# Patient Record
Sex: Male | Born: 1955
Health system: Southern US, Community
[De-identification: ages and names within clinical notes are randomized; demographics above are authoritative.]

## PROBLEM LIST (undated history)

## (undated) DIAGNOSIS — J449 Chronic obstructive pulmonary disease, unspecified: Secondary | ICD-10-CM

## (undated) DIAGNOSIS — D1720 Benign lipomatous neoplasm of skin and subcutaneous tissue of unspecified limb: Secondary | ICD-10-CM

## (undated) DIAGNOSIS — H9190 Unspecified hearing loss, unspecified ear: Secondary | ICD-10-CM

## (undated) DIAGNOSIS — D649 Anemia, unspecified: Secondary | ICD-10-CM

## (undated) DIAGNOSIS — E785 Hyperlipidemia, unspecified: Secondary | ICD-10-CM

## (undated) DIAGNOSIS — F419 Anxiety disorder, unspecified: Secondary | ICD-10-CM

## (undated) DIAGNOSIS — K219 Gastro-esophageal reflux disease without esophagitis: Secondary | ICD-10-CM

## (undated) DIAGNOSIS — N451 Epididymitis: Secondary | ICD-10-CM

## (undated) DIAGNOSIS — Z789 Other specified health status: Secondary | ICD-10-CM

## (undated) HISTORY — DX: Benign lipomatous neoplasm of skin and subcutaneous tissue of unspecified limb: D17.20

## (undated) HISTORY — PX: VASECTOMY: SHX75

## (undated) HISTORY — DX: Epididymitis: N45.1

## (undated) HISTORY — PX: CYST EXCISION: SHX5701

## (undated) HISTORY — PX: TONSILLECTOMY: SUR1361

## (undated) HISTORY — DX: Unspecified hearing loss, unspecified ear: H91.90

## (undated) HISTORY — PX: COLONOSCOPY: SHX174

## (undated) HISTORY — DX: Hyperlipidemia, unspecified: E78.5

---

## 1998-08-20 ENCOUNTER — Encounter: Payer: Self-pay | Admitting: Otolaryngology

## 1998-08-20 ENCOUNTER — Ambulatory Visit (HOSPITAL_COMMUNITY): Admission: RE | Admit: 1998-08-20 | Discharge: 1998-08-20 | Payer: Self-pay | Admitting: Otolaryngology

## 2001-04-04 ENCOUNTER — Ambulatory Visit (HOSPITAL_COMMUNITY): Admission: RE | Admit: 2001-04-04 | Discharge: 2001-04-04 | Payer: Self-pay | Admitting: Gastroenterology

## 2003-09-06 ENCOUNTER — Emergency Department (HOSPITAL_COMMUNITY): Admission: EM | Admit: 2003-09-06 | Discharge: 2003-09-06 | Payer: Self-pay | Admitting: Emergency Medicine

## 2011-07-07 ENCOUNTER — Other Ambulatory Visit: Payer: Self-pay | Admitting: Dermatology

## 2011-07-07 ENCOUNTER — Other Ambulatory Visit (HOSPITAL_COMMUNITY): Payer: Self-pay | Admitting: Dermatology

## 2011-07-07 DIAGNOSIS — D1779 Benign lipomatous neoplasm of other sites: Secondary | ICD-10-CM

## 2011-07-09 ENCOUNTER — Other Ambulatory Visit (HOSPITAL_COMMUNITY): Payer: Self-pay

## 2012-02-16 ENCOUNTER — Other Ambulatory Visit: Payer: Self-pay | Admitting: Gastroenterology

## 2014-06-29 ENCOUNTER — Ambulatory Visit (INDEPENDENT_AMBULATORY_CARE_PROVIDER_SITE_OTHER): Payer: Self-pay | Admitting: Surgery

## 2014-06-29 NOTE — H&P (Signed)
Patient words: CYST ON ARM.  The patient is a 59 year old male who presents with a complaint of Mass. Referred by Dr. London Pepper for evaluation of left arm lipoma The patient is a 59 yo male in good health who presents with a long history of a slowly enlarging mass on his left upper arm. This mass has been present for many years. It has become quite large and protrudes noticeably. It causes minimal discomfort. However, because of the persistent enlargement, he would like to have this area removed. This has never become infected. His wife is a Marine scientist at [employer] on [address]. Other Problems Elbert Ewings, CMA; 06/29/2014 10:03 AM) Gastroesophageal Reflux Disease  Past Surgical History Elbert Ewings, CMA; 06/29/2014 10:03 AM) Colon Polyp Removal - Colonoscopy Oral Surgery Vasectomy  Diagnostic Studies History Elbert Ewings, CMA; 06/29/2014 10:03 AM) Colonoscopy 1-5 years ago  Allergies Elbert Ewings, CMA; 06/29/2014 10:03 AM) No Known Drug Allergies 06/29/2014  Medication History Elbert Ewings, CMA; 06/29/2014 10:03 AM) No Current Medications Medications Reconciled  Social History Elbert Ewings, CMA; 06/29/2014 10:03 AM) Alcohol use Moderate alcohol use. Caffeine use Coffee. No drug use Tobacco use Former smoker.  Family History Elbert Ewings, [place]; 06/29/2014 10:03 AM) Breast Cancer Sister. Colon Cancer Father. Family history unknown First Degree Relatives Heart disease in male family member before age 13 Melanoma Father. Prostate Cancer Father. Thyroid problems Mother.     Review of Systems Elbert Ewings CMA; 06/29/2014 10:03 AM) General Not Present- Appetite Loss, Chills, Fatigue, Fever, Night Sweats, Weight Gain and Weight Loss. Skin Not Present- Change in Wart/Mole, Dryness, Hives, Jaundice, New Lesions, Non-Healing Wounds, Rash and Ulcer. HEENT Present- Wears glasses/contact lenses. Not Present- Earache, Hearing Loss, Hoarseness, Nose Bleed, Oral Ulcers, Ringing  in the Ears, Seasonal Allergies, Sinus Pain, Sore Throat, Visual Disturbances and Yellow Eyes. Respiratory Present- Snoring. Not Present- Bloody sputum, Chronic Cough, Difficulty Breathing and Wheezing. Breast Not Present- Breast Mass, Breast Pain, Nipple Discharge and Skin Changes. Cardiovascular Not Present- Chest Pain, Difficulty Breathing Lying Down, Leg Cramps, Palpitations, Rapid Heart Rate, Shortness of Breath and Swelling of Extremities. Gastrointestinal Not Present- Abdominal Pain, Bloating, Bloody Stool, Change in Bowel Habits, Chronic diarrhea, Constipation, Difficulty Swallowing, Excessive gas, Gets full quickly at meals, Hemorrhoids, Indigestion, Nausea, Rectal Pain and Vomiting. Male Genitourinary Not Present- Blood in Urine, Change in Urinary Stream, Frequency, Impotence, Nocturia, Painful Urination, Urgency and Urine Leakage. Musculoskeletal Not Present- Back Pain, Joint Pain, Joint Stiffness, Muscle Pain, Muscle Weakness and Swelling of Extremities. Neurological Not Present- Decreased Memory, Fainting, Headaches, Numbness, Seizures, Tingling, Tremor, Trouble walking and Weakness. Psychiatric Not Present- Anxiety, Bipolar, Change in Sleep Pattern, Depression, Fearful and Frequent crying. Endocrine Not Present- Cold Intolerance, Excessive Hunger, Hair Changes, Heat Intolerance, Hot flashes and New Diabetes. Hematology Not Present- Easy Bruising, Excessive bleeding, Gland problems, HIV and Persistent Infections.  Vitals Elbert Ewings CMA; 06/29/2014 10:04 AM) 06/29/2014 10:03 AM Weight: 198 lb Height: 73in Body Surface Area: 2.15 m Body Mass Index: 26.12 kg/m Temp.: 96.32F(Temporal)  Pulse: 72 (Regular)  Resp.: 16 (Unlabored)  BP: 128/62 (Sitting, Left Arm, Standard)     Physical Exam Rodman Key K. Kathrin Folden MD; 06/29/2014 10:31 AM)  The physical exam findings are as follows: Note:WDWN in NAD HEENT: EOMI, sclera anicteric Neck: No masses, no thyromegaly Lungs: CTA  bilaterally; normal respiratory effort CV: Regular rate and rhythm; no murmurs Abd: +bowel sounds, soft, non-tender, no masses Ext: Well-perfused; left upper arm over biceps - 12 cm protruding soft tissue mass; smooth; mobile  Skin: Warm, dry; no sign of jaundice    Assessment & Plan Imogene Burn. 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 The surgical procedure has been discussed with the patient. Potential risks, benefits, alternative treatments, and expected outcomes have been explained. All of the patient's questions at this time have been answered. The likelihood of reaching the patient's treatment goal is good. The patient understand the proposed surgical procedure and wishes to proceed.   Imogene Burn. [name], MD, [facility] Surgery  General/ Trauma Surgery  06/29/2014 10:37 AM

## 2014-07-17 ENCOUNTER — Other Ambulatory Visit (HOSPITAL_COMMUNITY): Payer: Self-pay | Admitting: *Deleted

## 2014-07-17 NOTE — Pre-Procedure Instructions (Addendum)
Charles Myers  07/17/2014   Your procedure is scheduled on:  Friday, July 27, 2014 at 7:30 AM.   Report to [facility] Entrance "A" Admitting Office at 5:30 AM.   Call this number if you have problems the morning of surgery: [phone]               Any questions prior to day of surgery, please call [phone] between 8 & 4 PM.   Remember:   Do not eat food or drink liquids after midnight Thursday, 07/26/14.    Take these medicines the morning of surgery with A SIP OF WATER: None  Stop Vitamins 7 days prior to surgery.   Do not wear jewelry.  Do not wear lotions, powders, or cologne. You may wear deodorant.  Do not shave 48 hours prior to surgery. Men may shave face and neck.  Do not bring valuables to the hospital.  [facility] is not responsible   for any belongings or valuables.               Contacts, dentures or bridgework may not be worn into surgery.  Leave suitcase in the car. After surgery it may be brought to your room.  For patients admitted to the hospital, discharge time is determined by your  treatment team.               Patients discharged the day of surgery will not be allowed to drive home.    Special Instructions: [name] - Preparing for Surgery  Before surgery, you can play an important role.  Because skin is not sterile, your skin needs to be as free of germs as possible.  You can reduce the number of germs on you skin by washing with CHG (chlorahexidine gluconate) soap before surgery.  CHG is an antiseptic cleaner which kills germs and bonds with the skin to continue killing germs even after washing.  Please DO NOT use if you have an allergy to CHG or antibacterial soaps.  If your skin becomes reddened/irritated stop using the CHG and inform your nurse when you arrive at Short Stay.  Do not shave (including legs and underarms) for at least 48 hours prior to the first CHG shower.  You may shave your face.  Please follow these instructions  carefully:   1.  Shower with CHG Soap the night before surgery and the   morning of Surgery.  2.  If you choose to wash your hair, wash your hair first as usual with you  normal shampoo.  3.  After you shampoo, rinse your hair and body thoroughly to remove the  Shampoo.  4.  Use CHG as you would any other liquid soap.  You can apply chg directly  to the skin and wash gently with scrungie or a clean washcloth.  5.  Apply the CHG Soap to your body ONLY FROM THE NECK DOWN.        Do not use on open wounds or open sores.  Avoid contact with your eyes, ears, mouth and genitals (private parts).  Wash genitals (private parts) with your normal soap.  6.  Wash thoroughly, paying special attention to the area where your surgery        will be performed.  7.  Thoroughly rinse your body with warm water from the neck down.  8.  DO NOT shower/wash with your normal soap after using and rinsing off  the CHG Soap.  9.  Pat yourself  dry with a clean towel.            10.  Wear clean pajamas.            11.  Place clean sheets on your bed the night of your first shower and do not  sleep with pets.  Day of Surgery  Do not apply any lotions the morning of surgery.  Please wear clean clothes to the hospital.     Please read over the following fact sheets that you were given: Pain Booklet and Surgical Site Infection Prevention

## 2014-07-18 ENCOUNTER — Encounter (HOSPITAL_COMMUNITY): Payer: Self-pay

## 2014-07-18 ENCOUNTER — Encounter (HOSPITAL_COMMUNITY)
Admission: RE | Admit: 2014-07-18 | Discharge: 2014-07-18 | Disposition: A | Discharge status: Home / Self Care | Payer: 59 | Source: Ambulatory Visit | Attending: Surgery | Admitting: Surgery

## 2014-07-18 DIAGNOSIS — Z01812 Encounter for preprocedural laboratory examination: Secondary | ICD-10-CM | POA: Insufficient documentation

## 2014-07-18 HISTORY — DX: Anxiety disorder, unspecified: F41.9

## 2014-07-18 HISTORY — DX: Other specified health status: Z78.9

## 2014-07-18 LAB — CBC
HEMATOCRIT: 46.3 % (ref 39.0–52.0)
HEMOGLOBIN: 15.9 g/dL (ref 13.0–17.0)
MCH: 30.4 pg (ref 26.0–34.0)
MCHC: 34.3 g/dL (ref 30.0–36.0)
MCV: 88.5 fL (ref 78.0–100.0)
Platelets: 201 10*3/uL (ref 150–400)
RBC: 5.23 MIL/uL (ref 4.22–5.81)
RDW: 13.6 % (ref 11.5–15.5)
WBC: 7 10*3/uL (ref 4.0–10.5)

## 2014-07-26 MED ORDER — CHLORHEXIDINE GLUCONATE 4 % EX LIQD
1.0000 "application " | Freq: Once | CUTANEOUS | Status: DC
  Filled 2014-07-26: qty 15

## 2014-07-26 MED ORDER — CEFAZOLIN SODIUM-DEXTROSE 2-3 GM-% IV SOLR
2.0000 g | INTRAVENOUS | Status: AC
Start: 2014-07-27 — End: 2014-07-27
  Administered 2014-07-27: 2 g via INTRAVENOUS
  Filled 2014-07-26: qty 50

## 2014-07-27 ENCOUNTER — Ambulatory Visit (HOSPITAL_COMMUNITY): Payer: 59 | Admitting: Anesthesiology

## 2014-07-27 ENCOUNTER — Ambulatory Visit (HOSPITAL_COMMUNITY)
Admission: RE | Admit: 2014-07-27 | Discharge: 2014-07-27 | Disposition: A | Discharge status: Home / Self Care | Payer: 59 | Source: Ambulatory Visit | Attending: Surgery | Admitting: Surgery

## 2014-07-27 ENCOUNTER — Encounter (HOSPITAL_COMMUNITY)
Admission: RE | Disposition: A | Discharge status: Home / Self Care | Payer: Self-pay | Source: Ambulatory Visit | Attending: Surgery

## 2014-07-27 ENCOUNTER — Encounter (HOSPITAL_COMMUNITY): Payer: Self-pay | Admitting: *Deleted

## 2014-07-27 DIAGNOSIS — Z9852 Vasectomy status: Secondary | ICD-10-CM | POA: Insufficient documentation

## 2014-07-27 DIAGNOSIS — Z800 Family history of malignant neoplasm of digestive organs: Secondary | ICD-10-CM | POA: Insufficient documentation

## 2014-07-27 DIAGNOSIS — K219 Gastro-esophageal reflux disease without esophagitis: Secondary | ICD-10-CM | POA: Diagnosis not present

## 2014-07-27 DIAGNOSIS — Z87891 Personal history of nicotine dependence: Secondary | ICD-10-CM | POA: Diagnosis not present

## 2014-07-27 DIAGNOSIS — Z86010 Personal history of colonic polyps: Secondary | ICD-10-CM | POA: Insufficient documentation

## 2014-07-27 DIAGNOSIS — D1722 Benign lipomatous neoplasm of skin and subcutaneous tissue of left arm: Secondary | ICD-10-CM | POA: Insufficient documentation

## 2014-07-27 DIAGNOSIS — Z8052 Family history of malignant neoplasm of bladder: Secondary | ICD-10-CM | POA: Insufficient documentation

## 2014-07-27 HISTORY — PX: LIPOMA EXCISION: SHX5283

## 2014-07-27 SURGERY — EXCISION LIPOMA
Anesthesia: General | Site: Arm Upper | Laterality: Left

## 2014-07-27 MED ORDER — HYDROMORPHONE HCL 1 MG/ML IJ SOLN: 0.2500 mg | INTRAMUSCULAR | Status: DC | PRN

## 2014-07-27 MED ORDER — KETOROLAC TROMETHAMINE 30 MG/ML IJ SOLN
INTRAMUSCULAR | Status: AC
  Filled 2014-07-27: qty 1

## 2014-07-27 MED ORDER — HYDROCODONE-ACETAMINOPHEN 5-325 MG PO TABS
2.0000 | ORAL_TABLET | Freq: Once | ORAL | Status: AC
Start: 2014-07-27 — End: 2014-07-27
  Administered 2014-07-27: 2 via ORAL

## 2014-07-27 MED ORDER — BUPIVACAINE-EPINEPHRINE 0.25% -1:200000 IJ SOLN
INTRAMUSCULAR | Status: DC | PRN
  Administered 2014-07-27: 30 mL

## 2014-07-27 MED ORDER — 0.9 % SODIUM CHLORIDE (POUR BTL) OPTIME
TOPICAL | Status: DC | PRN
  Administered 2014-07-27: 1000 mL

## 2014-07-27 MED ORDER — MIDAZOLAM HCL 5 MG/5ML IJ SOLN
INTRAMUSCULAR | Status: DC | PRN
  Administered 2014-07-27: 2 mg via INTRAVENOUS

## 2014-07-27 MED ORDER — MORPHINE SULFATE 2 MG/ML IJ SOLN: 2.0000 mg | INTRAMUSCULAR | Status: DC | PRN

## 2014-07-27 MED ORDER — HYDROCODONE-ACETAMINOPHEN 5-325 MG PO TABS: 1.0000 | ORAL_TABLET | ORAL | Status: DC | PRN

## 2014-07-27 MED ORDER — OXYCODONE HCL 5 MG/5ML PO SOLN: 5.0000 mg | Freq: Once | ORAL | Status: DC | PRN

## 2014-07-27 MED ORDER — BUPIVACAINE-EPINEPHRINE (PF) 0.25% -1:200000 IJ SOLN
INTRAMUSCULAR | Status: AC
Start: 2014-07-27 — End: 2014-07-27
  Filled 2014-07-27: qty 30

## 2014-07-27 MED ORDER — LACTATED RINGERS IV SOLN
INTRAVENOUS | Status: DC | PRN
  Administered 2014-07-27: 07:00:00 via INTRAVENOUS

## 2014-07-27 MED ORDER — PROPOFOL 10 MG/ML IV BOLUS
INTRAVENOUS | Status: AC
  Filled 2014-07-27: qty 20

## 2014-07-27 MED ORDER — ONDANSETRON HCL 4 MG/2ML IJ SOLN: 4.0000 mg | INTRAMUSCULAR | Status: DC | PRN

## 2014-07-27 MED ORDER — KETOROLAC TROMETHAMINE 30 MG/ML IJ SOLN
30.0000 mg | Freq: Once | INTRAMUSCULAR | Status: AC | PRN
  Administered 2014-07-27: 30 mg via INTRAVENOUS

## 2014-07-27 MED ORDER — LIDOCAINE HCL (CARDIAC) 20 MG/ML IV SOLN
INTRAVENOUS | Status: DC | PRN
  Administered 2014-07-27: 45 mg via INTRAVENOUS

## 2014-07-27 MED ORDER — ONDANSETRON HCL 4 MG/2ML IJ SOLN
INTRAMUSCULAR | Status: AC
  Filled 2014-07-27: qty 2

## 2014-07-27 MED ORDER — FENTANYL CITRATE 0.05 MG/ML IJ SOLN
INTRAMUSCULAR | Status: AC
  Filled 2014-07-27: qty 5

## 2014-07-27 MED ORDER — PROPOFOL 10 MG/ML IV BOLUS
INTRAVENOUS | Status: DC | PRN
  Administered 2014-07-27: 200 mg via INTRAVENOUS

## 2014-07-27 MED ORDER — LIDOCAINE HCL (CARDIAC) 20 MG/ML IV SOLN
INTRAVENOUS | Status: AC
  Filled 2014-07-27: qty 5

## 2014-07-27 MED ORDER — OXYCODONE HCL 5 MG PO TABS: 5.0000 mg | ORAL_TABLET | Freq: Once | ORAL | Status: DC | PRN

## 2014-07-27 MED ORDER — MEPERIDINE HCL 25 MG/ML IJ SOLN: 6.2500 mg | INTRAMUSCULAR | Status: DC | PRN

## 2014-07-27 MED ORDER — HYDROCODONE-ACETAMINOPHEN 5-325 MG PO TABS
ORAL_TABLET | ORAL | Status: AC
  Filled 2014-07-27: qty 2

## 2014-07-27 MED ORDER — FENTANYL CITRATE 0.05 MG/ML IJ SOLN
INTRAMUSCULAR | Status: DC | PRN
  Administered 2014-07-27: 50 ug via INTRAVENOUS

## 2014-07-27 MED ORDER — ONDANSETRON HCL 4 MG/2ML IJ SOLN
INTRAMUSCULAR | Status: DC | PRN
  Administered 2014-07-27: 4 mg via INTRAVENOUS

## 2014-07-27 MED ORDER — MIDAZOLAM HCL 2 MG/2ML IJ SOLN
INTRAMUSCULAR | Status: AC
  Filled 2014-07-27: qty 2

## 2014-07-27 MED ORDER — PROMETHAZINE HCL 25 MG/ML IJ SOLN: 6.2500 mg | INTRAMUSCULAR | Status: DC | PRN

## 2014-07-27 SURGICAL SUPPLY — 48 items
BANDAGE ELASTIC 4 VELCRO ST LF (GAUZE/BANDAGES/DRESSINGS) ×2 IMPLANT
BENZOIN TINCTURE PRP APPL 2/3 (GAUZE/BANDAGES/DRESSINGS) ×2 IMPLANT
BLADE SURG 10 STRL SS (BLADE) ×2 IMPLANT
BLADE SURG 15 STRL LF DISP TIS (BLADE) ×1 IMPLANT
BLADE SURG 15 STRL SS (BLADE) ×1
BLADE SURG ROTATE 9660 (MISCELLANEOUS) IMPLANT
CHLORAPREP W/TINT 26ML (MISCELLANEOUS) ×2 IMPLANT
COVER SURGICAL LIGHT HANDLE (MISCELLANEOUS) ×2 IMPLANT
DRAIN CHANNEL 10F 3/8 F FF (DRAIN) ×2 IMPLANT
DRAPE LAPAROTOMY T 98X78 PEDS (DRAPES) ×2 IMPLANT
DRAPE UTILITY XL STRL (DRAPES) ×2 IMPLANT
ELECT CAUTERY BLADE 6.4 (BLADE) ×2 IMPLANT
ELECT REM PT RETURN 9FT ADLT (ELECTROSURGICAL) ×2
ELECTRODE REM PT RTRN 9FT ADLT (ELECTROSURGICAL) ×1 IMPLANT
EVACUATOR SILICONE 100CC (DRAIN) ×2 IMPLANT
GAUZE SPONGE 4X4 12PLY STRL (GAUZE/BANDAGES/DRESSINGS) ×2 IMPLANT
GLOVE BIO SURGEON STRL SZ7 (GLOVE) ×6 IMPLANT
GLOVE BIO SURGEON STRL SZ7.5 (GLOVE) ×2 IMPLANT
GLOVE BIOGEL PI IND STRL 7.0 (GLOVE) ×1 IMPLANT
GLOVE BIOGEL PI IND STRL 7.5 (GLOVE) ×1 IMPLANT
GLOVE BIOGEL PI INDICATOR 7.0 (GLOVE) ×1
GLOVE BIOGEL PI INDICATOR 7.5 (GLOVE) ×1
GOWN STRL REUS W/ TWL LRG LVL3 (GOWN DISPOSABLE) ×2 IMPLANT
GOWN STRL REUS W/TWL LRG LVL3 (GOWN DISPOSABLE) ×2
KIT BASIN OR (CUSTOM PROCEDURE TRAY) ×2 IMPLANT
KIT ROOM TURNOVER OR (KITS) ×2 IMPLANT
NEEDLE HYPO 25GX1X1/2 BEV (NEEDLE) ×2 IMPLANT
NS IRRIG 1000ML POUR BTL (IV SOLUTION) ×2 IMPLANT
PACK SURGICAL SETUP 50X90 (CUSTOM PROCEDURE TRAY) ×2 IMPLANT
PAD ARMBOARD 7.5X6 YLW CONV (MISCELLANEOUS) ×2 IMPLANT
PENCIL BUTTON HOLSTER BLD 10FT (ELECTRODE) ×2 IMPLANT
SPECIMEN JAR SMALL (MISCELLANEOUS) ×2 IMPLANT
SPONGE GAUZE 4X4 12PLY STER LF (GAUZE/BANDAGES/DRESSINGS) ×2 IMPLANT
SPONGE LAP 18X18 X RAY DECT (DISPOSABLE) ×2 IMPLANT
STRIP CLOSURE SKIN 1/2X4 (GAUZE/BANDAGES/DRESSINGS) ×2 IMPLANT
SUT ETHILON 2 0 FS 18 (SUTURE) ×2 IMPLANT
SUT MNCRL AB 4-0 PS2 18 (SUTURE) ×2 IMPLANT
SUT VIC AB 2-0 SH 27 (SUTURE)
SUT VIC AB 2-0 SH 27X BRD (SUTURE) IMPLANT
SUT VIC AB 3-0 SH 27 (SUTURE) ×1
SUT VIC AB 3-0 SH 27XBRD (SUTURE) ×1 IMPLANT
SUT VIC AB 3-0 SH 8-18 (SUTURE) ×2 IMPLANT
SYR BULB 3OZ (MISCELLANEOUS) ×2 IMPLANT
SYR CONTROL 10ML LL (SYRINGE) ×2 IMPLANT
TOWEL OR 17X24 6PK STRL BLUE (TOWEL DISPOSABLE) ×2 IMPLANT
TOWEL OR 17X26 10 PK STRL BLUE (TOWEL DISPOSABLE) ×2 IMPLANT
TUBE CONNECTING 12X1/4 (SUCTIONS) ×2 IMPLANT
YANKAUER SUCT BULB TIP NO VENT (SUCTIONS) ×2 IMPLANT

## 2014-07-27 NOTE — H&P (View-Only) (Signed)
Patient words: CYST ON ARM.  The patient is a 59 year old male who presents with a complaint of Mass. Referred by Dr. London Pepper for evaluation of left arm lipoma The patient is a 59 yo male in good health who presents with a long history of a slowly enlarging mass on his left upper arm. This mass has been present for many years. It has become quite large and protrudes noticeably. It causes minimal discomfort. However, because of the persistent enlargement, he would like to have this area removed. This has never become infected. His wife is a Marine scientist at [employer] on [address]. Other Problems Elbert Ewings, CMA; 06/29/2014 10:03 AM) Gastroesophageal Reflux Disease  Past Surgical History Elbert Ewings, CMA; 06/29/2014 10:03 AM) Colon Polyp Removal - Colonoscopy Oral Surgery Vasectomy  Diagnostic Studies History Elbert Ewings, CMA; 06/29/2014 10:03 AM) Colonoscopy 1-5 years ago  Allergies Elbert Ewings, CMA; 06/29/2014 10:03 AM) No Known Drug Allergies 06/29/2014  Medication History Elbert Ewings, CMA; 06/29/2014 10:03 AM) No Current Medications Medications Reconciled  Social History Elbert Ewings, CMA; 06/29/2014 10:03 AM) Alcohol use Moderate alcohol use. Caffeine use Coffee. No drug use Tobacco use Former smoker.  Family History Elbert Ewings, [place]; 06/29/2014 10:03 AM) Breast Cancer Sister. Colon Cancer Father. Family history unknown First Degree Relatives Heart disease in male family member before age 29 Melanoma Father. Prostate Cancer Father. Thyroid problems Mother.     Review of Systems Elbert Ewings CMA; 06/29/2014 10:03 AM) General Not Present- Appetite Loss, Chills, Fatigue, Fever, Night Sweats, Weight Gain and Weight Loss. Skin Not Present- Change in Wart/Mole, Dryness, Hives, Jaundice, New Lesions, Non-Healing Wounds, Rash and Ulcer. HEENT Present- Wears glasses/contact lenses. Not Present- Earache, Hearing Loss, Hoarseness, Nose Bleed, Oral Ulcers, Ringing  in the Ears, Seasonal Allergies, Sinus Pain, Sore Throat, Visual Disturbances and Yellow Eyes. Respiratory Present- Snoring. Not Present- Bloody sputum, Chronic Cough, Difficulty Breathing and Wheezing. Breast Not Present- Breast Mass, Breast Pain, Nipple Discharge and Skin Changes. Cardiovascular Not Present- Chest Pain, Difficulty Breathing Lying Down, Leg Cramps, Palpitations, Rapid Heart Rate, Shortness of Breath and Swelling of Extremities. Gastrointestinal Not Present- Abdominal Pain, Bloating, Bloody Stool, Change in Bowel Habits, Chronic diarrhea, Constipation, Difficulty Swallowing, Excessive gas, Gets full quickly at meals, Hemorrhoids, Indigestion, Nausea, Rectal Pain and Vomiting. Male Genitourinary Not Present- Blood in Urine, Change in Urinary Stream, Frequency, Impotence, Nocturia, Painful Urination, Urgency and Urine Leakage. Musculoskeletal Not Present- Back Pain, Joint Pain, Joint Stiffness, Muscle Pain, Muscle Weakness and Swelling of Extremities. Neurological Not Present- Decreased Memory, Fainting, Headaches, Numbness, Seizures, Tingling, Tremor, Trouble walking and Weakness. Psychiatric Not Present- Anxiety, Bipolar, Change in Sleep Pattern, Depression, Fearful and Frequent crying. Endocrine Not Present- Cold Intolerance, Excessive Hunger, Hair Changes, Heat Intolerance, Hot flashes and New Diabetes. Hematology Not Present- Easy Bruising, Excessive bleeding, Gland problems, HIV and Persistent Infections.  Vitals Elbert Ewings CMA; 06/29/2014 10:04 AM) 06/29/2014 10:03 AM Weight: 198 lb Height: 73in Body Surface Area: 2.15 m Body Mass Index: 26.12 kg/m Temp.: 96.15F(Temporal)  Pulse: 72 (Regular)  Resp.: 16 (Unlabored)  BP: 128/62 (Sitting, Left Arm, Standard)     Physical Exam Rodman Key K. Yareli Carthen MD; 06/29/2014 10:31 AM)  The physical exam findings are as follows: Note:WDWN in NAD HEENT: EOMI, sclera anicteric Neck: No masses, no thyromegaly Lungs: CTA  bilaterally; normal respiratory effort CV: Regular rate and rhythm; no murmurs Abd: +bowel sounds, soft, non-tender, no masses Ext: Well-perfused; left upper arm over biceps - 12 cm protruding soft tissue mass; smooth; mobile  Skin: Warm, dry; no sign of jaundice    Assessment & Plan Imogene Burn. 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 The surgical procedure has been discussed with the patient. Potential risks, benefits, alternative treatments, and expected outcomes have been explained. All of the patient's questions at this time have been answered. The likelihood of reaching the patient's treatment goal is good. The patient understand the proposed surgical procedure and wishes to proceed.   Imogene Burn. [name], MD, [facility] Surgery  General/ Trauma Surgery  06/29/2014 10:37 AM

## 2014-07-27 NOTE — Op Note (Signed)
Pre-op Diagnosis - large subcutaneous lipoma - left upper arm (12 cm) Post-op Diagnosis:  Same Procedure:  Excision of subcutaneous lipoma - left upper arm Surgeon:  Nadean Montanaro K. Anesthesia:  Gen - LMA Indications:  This is a 59 yo male who presents with many years of an enlarging mass in his upper left arm over his biceps.  It has caused some discomfort and has become very large.  He presents now for excision.  Description of procedure: The patient is brought to the operating room placed in supine position on the operating table. After adequate level of general anesthesia was obtained, his left arm was placed on an arm board. The left biceps area was prepped with ChloraPrep and draped sterile fashion. A timeout was taken to ensure the proper patient and proper procedure. An elliptical incision was made over the middle of the large protruding mass. Dissection was carried down through the subcutaneous tissues to the surface of the lipoma. Blunt dissection was used to dissect the lipoma free from the surrounding tissues. The capsule of the lipoma was adherent to the underlying biceps muscle. This was dissected free with cautery. The specimen was removed and sent for pathologic examination. Hemostasis was obtained with cautery and a single 3-0 Vicryl tie on a vein. We irrigated the wound thoroughly and inspected for hemostasis.  A 10 [place] Blake drain was brought in through a stab incision and placed in the wound. It was secured with a 2-0 nylon suture. The wound was closed with multiple interrupted 3-0 Vicryl subcutaneous sutures. 4 Monocryl was used to close the skin. Steri-Strips and clean dressings were applied. The drain was placed to bulb suction. The entire area was wrapped and an Ace wrap. The patient was then extubated and brought to recovery room in stable condition. All sponge, instrument, and needle counts are correct.  Imogene Burn. [name], MD, [facility] Surgery  General/ Trauma  Surgery  07/27/2014 8:51 AM

## 2014-07-27 NOTE — Transfer of Care (Signed)
Immediate Anesthesia Transfer of Care Note  Patient: Charles Myers  Procedure(s) Performed: Procedure(s): EXCISION LIPOMA LEFT UPPER ARM (Left)  Patient Location: PACU  Anesthesia Type:General  Level of Consciousness: awake, alert  and oriented  Airway & Oxygen Therapy: Patient Spontanous Breathing and Patient connected to nasal cannula oxygen  Post-op Assessment: Report given to RN, Post -op Vital signs reviewed and stable and Patient moving all extremities  Post vital signs: Reviewed and stable  Last Vitals:  Filed Vitals:   07/27/14 0551  BP: 154/86  Temp: 36.5 C  Resp: 18    Complications: No apparent anesthesia complications

## 2014-07-27 NOTE — Interval H&P Note (Signed)
History and Physical Interval Note:  07/27/2014 7:20 AM  Charles Myers  has presented today for surgery, with the diagnosis of Lipoma Left Upper Arm  The various methods of treatment have been discussed with the patient and family. After consideration of risks, benefits and other options for treatment, the patient has consented to  Procedure(s): EXCISION LIPOMA LEFT UPPER ARM (Left) as a surgical intervention .  The patient's history has been reviewed, patient examined, no change in status, stable for surgery.  I have reviewed the patient's chart and labs.  Questions were answered to the patient's satisfaction.     Advik Weatherspoon K.

## 2014-07-27 NOTE — Anesthesia Preprocedure Evaluation (Addendum)
Anesthesia Evaluation  Patient identified by MRN, date of birth, ID band Patient awake    Reviewed: Allergy & Precautions, NPO status , Patient's Chart, lab work & pertinent test results  Airway Mallampati: II  TM Distance: >3 FB Neck ROM: Full    Dental no notable dental hx.    Pulmonary neg pulmonary ROS, former smoker,  breath sounds clear to auscultation  Pulmonary exam normal       Cardiovascular negative cardio ROS  Rhythm:Regular Rate:Normal     Neuro/Psych negative neurological ROS  negative psych ROS   GI/Hepatic negative GI ROS, Neg liver ROS,   Endo/Other  negative endocrine ROS  Renal/GU negative Renal ROS     Musculoskeletal negative musculoskeletal ROS (+)   Abdominal   Peds  Hematology negative hematology ROS (+)   Anesthesia Other Findings   Reproductive/Obstetrics                            Anesthesia Physical Anesthesia Plan  ASA: I  Anesthesia Plan: General   Post-op Pain Management:    Induction: Intravenous  Airway Management Planned: LMA  Additional Equipment: None  Intra-op Plan:   Post-operative Plan: Extubation in OR  Informed Consent: I have reviewed the patients History and Physical, chart, labs and discussed the procedure including the risks, benefits and alternatives for the proposed anesthesia with the patient or authorized representative who has indicated his/her understanding and acceptance.   Dental advisory given  Plan Discussed with: CRNA  Anesthesia Plan Comments:         Anesthesia Quick Evaluation

## 2014-07-27 NOTE — Anesthesia Procedure Notes (Signed)
Procedure Name: LMA Insertion Date/Time: 07/27/2014 7:35 AM Performed by: Rush Farmer E Pre-anesthesia Checklist: Patient identified, Emergency Drugs available, Suction available, Patient being monitored and Timeout performed Patient Re-evaluated:Patient Re-evaluated prior to inductionOxygen Delivery Method: Circle system utilized Preoxygenation: Pre-oxygenation with 100% oxygen Intubation Type: IV induction Ventilation: Mask ventilation without difficulty LMA: LMA inserted LMA Size: 4.0 Number of attempts: 1 Placement Confirmation: positive ETCO2 and breath sounds checked- equal and bilateral Tube secured with: Tape Dental Injury: Teeth and Oropharynx as per pre-operative assessment

## 2014-07-27 NOTE — Discharge Instructions (Signed)
Drain - empty and record output daily.  When the drainage is less than 30 ml for a 24 hour period, you may cut the suture and remove the drain.  Cover the drain site with a dry dressing until it has formed a scab.  Keep the wound wrapped in an Ace wrap to provide some compression.    Wrap the dressing in plastic wrap for showers.  After the drain is out and the drain site has healed, you may shower over the steri-strips.    Do not use the left arm for any vigorous activity.  You can put an ice pack over the wound for the first few days.

## 2014-07-29 NOTE — Anesthesia Postprocedure Evaluation (Signed)
Anesthesia Post Note  Patient: Charles Myers  Procedure(s) Performed: Procedure(s) (LRB): EXCISION LIPOMA LEFT UPPER ARM (Left)  Anesthesia type: General  Patient location: PACU  Post pain: Pain level controlled  Post assessment: Post-op Vital signs reviewed  Last Vitals: BP 122/85 mmHg  Pulse 74  Temp(Src) 36.6 C (Oral)  Resp 24  Wt 200 lb 1.6 oz (90.765 kg)  SpO2 94%  Post vital signs: Reviewed  Level of consciousness: sedated  Complications: No apparent anesthesia complications

## 2014-07-30 ENCOUNTER — Encounter (HOSPITAL_COMMUNITY): Payer: Self-pay | Admitting: Surgery

## 2015-02-15 ENCOUNTER — Other Ambulatory Visit: Payer: Self-pay | Admitting: Acute Care

## 2015-02-15 DIAGNOSIS — Z87891 Personal history of nicotine dependence: Secondary | ICD-10-CM

## 2015-02-20 ENCOUNTER — Ambulatory Visit (INDEPENDENT_AMBULATORY_CARE_PROVIDER_SITE_OTHER): Payer: 59 | Admitting: Acute Care

## 2015-02-20 ENCOUNTER — Encounter: Payer: Self-pay | Admitting: Acute Care

## 2015-02-20 ENCOUNTER — Ambulatory Visit (HOSPITAL_COMMUNITY)
Admission: RE | Admit: 2015-02-20 | Discharge: 2015-02-20 | Disposition: A | Discharge status: Home / Self Care | Payer: 59 | Source: Ambulatory Visit | Attending: Acute Care | Admitting: Acute Care

## 2015-02-20 DIAGNOSIS — I2510 Atherosclerotic heart disease of native coronary artery without angina pectoris: Secondary | ICD-10-CM | POA: Insufficient documentation

## 2015-02-20 DIAGNOSIS — J432 Centrilobular emphysema: Secondary | ICD-10-CM | POA: Diagnosis not present

## 2015-02-20 DIAGNOSIS — Z87891 Personal history of nicotine dependence: Secondary | ICD-10-CM | POA: Diagnosis not present

## 2015-02-20 DIAGNOSIS — Z801 Family history of malignant neoplasm of trachea, bronchus and lung: Secondary | ICD-10-CM | POA: Insufficient documentation

## 2015-02-20 DIAGNOSIS — Z122 Encounter for screening for malignant neoplasm of respiratory organs: Secondary | ICD-10-CM | POA: Diagnosis not present

## 2015-02-20 NOTE — Progress Notes (Signed)
Shared Decision Making Visit Lung Cancer Screening Program [id number])   Eligibility:  Age 59 y.o.  Pack Years Smoking History Calculation : 30 pack years (# packs/per year x # years smoked)  Recent History of coughing up blood  no  Unexplained weight loss? no ( >Than 15 pounds within the last 6 months )  Prior History Lung / other cancer no (Diagnosis within the last 5 years already requiring surveillance chest CT Scans).  Smoking Status Former Smoker  Former Smokers: Years since quit: 6 years  Quit Date: 2010  Visit Components:  Discussion included one or more decision making aids. yes  Discussion included risk/benefits of screening. yes  Discussion included potential follow up diagnostic testing for abnormal scans. yes  Discussion included meaning and risk of over diagnosis. yes  Discussion included meaning and risk of False Positives. yes  Discussion included meaning of total radiation exposure. yes  Counseling Included:  Importance of adherence to annual lung cancer LDCT screening. yes  Impact of comorbidities on ability to participate in the program. yes  Ability and willingness to under diagnostic treatment. yes  Smoking Cessation Counseling:  Current Smokers:   Discussed importance of smoking cessation.Former smoker  Information about tobacco cessation classes and interventions provided to patient. NA; former smoker  Patient provided with "ticket" for LDCT Scan. See below  Symptomatic Patient. no  Counseling  Diagnosis Code: Tobacco Use Z72.0  Asymptomatic Patient yes  Counseling NA  Former Smokers:   Discussed the importance of maintaining cigarette abstinence. yes  Diagnosis Code: Personal History of Nicotine Dependence. 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 Yes  Patient provided with "ticket" for LDCT Scan. yes  Written Order for Lung Cancer Screening with LDCT placed in Epic.  Yes (CT Chest Lung Cancer Screening Low Dose W/O CM) [id number] Z12.2-Screening of respiratory organs Z87.891-Personal history of nicotine dependence  I spent 15 minutes of face to face time with Mr. Bolin discussing the risks and benefits of lung cancer screening. We viewed a power point together that explains the above noted topics, pausing at intervals to allow for questions to be asked and answered, to ensure understanding. We discussed that by quitting smoking he has taken the single most powerful action possible to decrease his risk of lung cancer. He was able to liberate himself from cigarettes with the use of Chantix. He did have " crazy" dreams, but he states that he actually enjoyed them. We discussed how important it is to remain smoke free. I have given him my card and contact information and told him if he ever feels the need to smoke again to call me so that I can help him remain smoke free. We discussed the time and location of his scan, and that I will call with the results within 24-48 hours of getting them. We discussed the process for both negative and positive scans. I also gave him a copy of the power point to refer to in the future, and to share with his wife. He has verbalized understanding of all of the above and had no further questions upon leaving the office.   Magdalen Spatz, NP

## 2015-02-21 ENCOUNTER — Telehealth: Payer: Self-pay | Admitting: Acute Care

## 2015-02-21 ENCOUNTER — Encounter: Payer: Self-pay | Admitting: Acute Care

## 2015-02-21 NOTE — Telephone Encounter (Signed)
I received a return call from Mr. Lupinacci regarding his screening scan results. I explained that his scan was read as a Lung RADS 1, no nodules or definitely benign nodules. I explained that the recommendation is for a  Repeat scan in 12 months, which we will call and schedule in mid September of 2017. He verbalized understanding of the results and had no further questions upon completing the call. He has my contact information in the event he has any further questions.

## 2015-06-26 DIAGNOSIS — H524 Presbyopia: Secondary | ICD-10-CM | POA: Diagnosis not present

## 2015-06-26 DIAGNOSIS — H5213 Myopia, bilateral: Secondary | ICD-10-CM | POA: Diagnosis not present

## 2015-07-09 DIAGNOSIS — Z13220 Encounter for screening for lipoid disorders: Secondary | ICD-10-CM | POA: Diagnosis not present

## 2015-07-09 DIAGNOSIS — Z0000 Encounter for general adult medical examination without abnormal findings: Secondary | ICD-10-CM | POA: Diagnosis not present

## 2015-07-09 DIAGNOSIS — M25521 Pain in right elbow: Secondary | ICD-10-CM | POA: Diagnosis not present

## 2015-07-09 DIAGNOSIS — I7090 Unspecified atherosclerosis: Secondary | ICD-10-CM | POA: Diagnosis not present

## 2015-07-09 DIAGNOSIS — Z125 Encounter for screening for malignant neoplasm of prostate: Secondary | ICD-10-CM | POA: Diagnosis not present

## 2015-07-09 DIAGNOSIS — Z131 Encounter for screening for diabetes mellitus: Secondary | ICD-10-CM | POA: Diagnosis not present

## 2015-07-23 ENCOUNTER — Other Ambulatory Visit: Payer: Self-pay | Admitting: Acute Care

## 2015-07-23 DIAGNOSIS — Z87891 Personal history of nicotine dependence: Secondary | ICD-10-CM

## 2015-07-31 DIAGNOSIS — M222X2 Patellofemoral disorders, left knee: Secondary | ICD-10-CM | POA: Diagnosis not present

## 2015-10-29 ENCOUNTER — Encounter: Payer: Self-pay | Admitting: Cardiovascular Disease

## 2015-11-14 ENCOUNTER — Encounter: Payer: Self-pay | Admitting: Cardiovascular Disease

## 2015-11-14 ENCOUNTER — Ambulatory Visit (INDEPENDENT_AMBULATORY_CARE_PROVIDER_SITE_OTHER): Payer: 59 | Admitting: Cardiovascular Disease

## 2015-11-14 VITALS — BP 108/80 | HR 70 | Ht 73.0 in | Wt 202.0 lb

## 2015-11-14 DIAGNOSIS — E785 Hyperlipidemia, unspecified: Secondary | ICD-10-CM | POA: Diagnosis not present

## 2015-11-14 DIAGNOSIS — I2584 Coronary atherosclerosis due to calcified coronary lesion: Secondary | ICD-10-CM

## 2015-11-14 DIAGNOSIS — I2510 Atherosclerotic heart disease of native coronary artery without angina pectoris: Secondary | ICD-10-CM | POA: Diagnosis not present

## 2015-11-14 MED ORDER — ATORVASTATIN CALCIUM 40 MG PO TABS: 40.0000 mg | ORAL_TABLET | Freq: Every day | ORAL | Status: DC

## 2015-11-14 NOTE — Patient Instructions (Signed)
Medication Instructions:  START Atorvastatin 40 mg once daily   Labwork: Your physician recommends that you return for lab work in: 3 months You will need to FAST for this appointment - nothing to eat or drink after midnight the night before except water.   Testing/Procedures: None Ordered   Follow-Up: Your physician recommends that you schedule a follow-up appointment in: as needed with Dr. Acie Fredrickson   If you need a refill on your cardiac medications before your next appointment, please call your pharmacy.   Thank you for choosing [facility]! Christen Bame, RN [phone]

## 2015-11-14 NOTE — Progress Notes (Signed)
Cardiology Office Note   Date:  11/14/2015   ID:  [name], DOB 31-Dec-1955, MRN [id number]  PCP:  London Pepper, MD  Cardiologist:   Mertie Moores, MD   No chief complaint on file.  Problem list 1. Coronary calcifications 2. hyperlipidmia    History of Present Illness: Charles Myers is a 60 y.o. male who presents for evaluation of coronary calcifications noted incidentally by CT scan .  No symptoms Had a CT scan for lung cancer screening and was incidentally noted to have coronary calcification .  Former smoker   Exercises regularly .  Walks at work .  Plays basketball regularly with younger players and never has any CP or dyspnea.     Past Medical History  Diagnosis Date  . Anxiety   . Medical history non-contributory     Past Surgical History  Procedure Laterality Date  . Tonsillectomy    . Vasectomy    . Cyst excision      removed from back  . Colonoscopy    . Lipoma excision Left 07/27/2014    Procedure: EXCISION LIPOMA LEFT UPPER ARM;  Surgeon: Donnie Mesa, MD;  Location: [place];  Service: General;  Laterality: Left;     Current Outpatient Prescriptions  Medication Sig Dispense Refill  . Multiple Vitamins-Minerals (MULTIVITAMIN WITH MINERALS) tablet Take 1 tablet by mouth daily.     No current facility-administered medications for this visit.    Allergies:   Review of patient's allergies indicates no known allergies.    Social History:  The patient  reports that he quit smoking about 7 years ago. His smoking use included Cigarettes. He has a 30 pack-year smoking history. He has never used smokeless tobacco. He reports that he drinks about 7.2 oz of alcohol per week. He reports that he does not use illicit drugs.   Family History:  The patient's family history includes Breast cancer in his sister; Colon cancer in his father; Lung cancer in his brother.    ROS:  Please see the history of present illness.    Review of  Systems: Constitutional:  denies fever, chills, diaphoresis, appetite change and fatigue.  HEENT: denies photophobia, eye pain, redness, hearing loss, ear pain, congestion, sore throat, rhinorrhea, sneezing, neck pain, neck stiffness and tinnitus.  Respiratory: denies SOB, DOE, cough, chest tightness, and wheezing.  Cardiovascular: denies chest pain, palpitations and leg swelling.  Gastrointestinal: denies nausea, vomiting, abdominal pain, diarrhea, constipation, blood in stool.  Genitourinary: denies dysuria, urgency, frequency, hematuria, flank pain and difficulty urinating.  Musculoskeletal: denies  myalgias, back pain, joint swelling, arthralgias and gait problem.   Skin: denies pallor, rash and wound.  Neurological: denies dizziness, seizures, syncope, weakness, light-headedness, numbness and headaches.   Hematological: denies adenopathy, easy bruising, personal or family bleeding history.  Psychiatric/ Behavioral: denies suicidal ideation, mood changes, confusion, nervousness, sleep disturbance and agitation.       All other systems are reviewed and negative.    PHYSICAL EXAM: VS:  BP 108/80 mmHg  Pulse 70  Ht 6\' 1"  (1.854 m)  Wt 202 lb (91.627 kg)  BMI 26.66 kg/m2 , BMI Body mass index is 26.66 kg/(m^2). GEN: Well nourished, well developed, in no acute distress HEENT: normal Neck: no JVD, carotid bruits, or masses Cardiac: RRR; no murmurs, rubs, or gallops,no edema  Respiratory:  clear to auscultation bilaterally, normal work of breathing GI: soft, nontender, nondistended, + BS MS: no deformity or atrophy Skin: warm and dry, no rash Neuro:  Strength and sensation are intact Psych: normal   EKG:  EKG is ordered today. The ekg ordered today demonstrates  NSR at 70.   Normal ECG    Recent Labs: No results found for requested labs within last 365 days.    Lipid Panel No results found for: CHOL, TRIG, HDL, CHOLHDL, VLDL, LDLCALC, LDLDIRECT    Wt Readings from Last 3  Encounters:  11/14/15 202 lb (91.627 kg)  07/27/14 200 lb 1.6 oz (90.765 kg)  07/18/14 200 lb 1.6 oz (90.765 kg)      Other studies Reviewed: Additional studies/ records that were reviewed today include: . Review of the above records demonstrates:    ASSESSMENT AND PLAN:  1.  Coronary artery calcifications: The patient presents with coronary artery calcifications that were incidentally noted on CT scan. He does not have any symptoms. He plays basketball and works out on a regular basis. He does have hyperlipidemia. This point I do not think that we need any further stress testing. I've encouraged him to exercise on a regular basis. We should start atorvastatin for hyperlipidemia.  2. Hyperlipidemia: His last LDL is 129. Given his coronary artery calcifications his LDL goal is 70. We will start him on atorvastatin 40 mg a day. We'll recheck this complete medical profile and lipid profile in 3 months. He'll follow-up with his general medical doctor for further management of his hyperlipidemia.     Current medicines are reviewed at length with the patient today.  The patient does not have concerns regarding medicines.  Labs/ tests ordered today include:  No orders of the defined types were placed in this encounter.     Disposition:   FU with me as needed      Mertie Moores, MD  11/14/2015 10:16 AM    [address] [place], [place], [place]  [address] Phone: [phone]; Fax: [phone]   [facility]  [address] [place], [place]  [address] [phone]   Fax [phone]

## 2015-12-12 MED FILL — ATORVASTATIN 40 MG TABLET: 40 | 90 days supply | Qty: 90 | Fill #0

## 2016-01-03 ENCOUNTER — Telehealth: Payer: Self-pay | Admitting: Cardiovascular Disease

## 2016-01-03 NOTE — Telephone Encounter (Signed)
Spoke with patient who states he has been having neck and back pain for 2 weeks after starting atorvastatin.  He states he is usually very active riding his back and playing basketball but he has not been as active over the past 2 weeks due to pain.  He states sometimes when he is sitting in a chair, it is difficult for him to stand up. He denies pain prior to starting atorvastatin.  I advised him to hold the atorvastatin for 2 weeks to see if symptoms resolve and call back to report.  I advised that if pain resolves while off atorvastatin, we will look at starting a different agent for his hyperlipidemia.  He verbalized understanding and agreement with plan.

## 2016-01-03 NOTE — Telephone Encounter (Signed)
New Message  Pt voiced he is having horrible back and neck pains for two weeks.  Pt voiced he is wondering if it's a side effect from atorvastatin.  Please follow up with pt. Thanks!

## 2016-01-20 ENCOUNTER — Telehealth: Payer: Self-pay | Admitting: Cardiovascular Disease

## 2016-01-20 MED ORDER — ATORVASTATIN CALCIUM 20 MG PO TABS: 20.0000 mg | ORAL_TABLET | Freq: Every day | ORAL | Status: DC

## 2016-01-20 NOTE — Telephone Encounter (Signed)
Spoke with patient's wife who states patient would like to take 20 mg of atorvastatin for awhile to see if he can tolerate.  I verbalized agreement and advised her to have him call back to report how he is tolerating.

## 2016-01-20 NOTE — Telephone Encounter (Signed)
New message      Pt c/o medication issue:  1. Name of Medication: atorvastatin 2. How are you currently taking this medication (dosage and times per day)? 40mg  daily 3. Are you having a reaction (difficulty breathing--STAT)? no 4. What is your medication issue? Calling to give an update.  Since being off medication for 2 weeks, pt has not had any stiffness in his neck or muscle pain.  He has a lot of 40mg  pills left.  Can he try cutting them in half and see if it makes his body ache?  If not, [facility] [address]

## 2016-02-14 ENCOUNTER — Other Ambulatory Visit: Payer: 59 | Admitting: *Deleted

## 2016-02-14 DIAGNOSIS — I2584 Coronary atherosclerosis due to calcified coronary lesion: Principal | ICD-10-CM

## 2016-02-14 DIAGNOSIS — E785 Hyperlipidemia, unspecified: Secondary | ICD-10-CM | POA: Diagnosis not present

## 2016-02-14 DIAGNOSIS — I2510 Atherosclerotic heart disease of native coronary artery without angina pectoris: Secondary | ICD-10-CM

## 2016-02-14 LAB — COMPREHENSIVE METABOLIC PANEL
ALBUMIN: 4.1 g/dL (ref 3.6–5.1)
ALT: 18 U/L (ref 9–46)
AST: 17 U/L (ref 10–35)
Alkaline Phosphatase: 75 U/L (ref 40–115)
BILIRUBIN TOTAL: 0.7 mg/dL (ref 0.2–1.2)
BUN: 18 mg/dL (ref 7–25)
CO2: 27 mmol/L (ref 20–31)
CREATININE: 0.9 mg/dL (ref 0.70–1.25)
Calcium: 9.1 mg/dL (ref 8.6–10.3)
Chloride: 105 mmol/L (ref 98–110)
Glucose, Bld: 87 mg/dL (ref 65–99)
Potassium: 3.9 mmol/L (ref 3.5–5.3)
SODIUM: 139 mmol/L (ref 135–146)
TOTAL PROTEIN: 6.4 g/dL (ref 6.1–8.1)

## 2016-02-14 LAB — LIPID PANEL
Cholesterol: 147 mg/dL (ref 125–200)
HDL: 39 mg/dL — AB (ref >=40)
LDL Cholesterol: 84 mg/dL (ref <130)
Total CHOL/HDL Ratio: 3.8 Ratio (ref <=5.0)
Triglycerides: 118 mg/dL (ref <150)
VLDL: 24 mg/dL (ref <30)

## 2016-02-27 ENCOUNTER — Ambulatory Visit (INDEPENDENT_AMBULATORY_CARE_PROVIDER_SITE_OTHER)
Admission: RE | Admit: 2016-02-27 | Discharge: 2016-02-27 | Disposition: A | Discharge status: Home / Self Care | Payer: 59 | Source: Ambulatory Visit | Attending: Acute Care | Admitting: Acute Care

## 2016-02-27 DIAGNOSIS — Z87891 Personal history of nicotine dependence: Secondary | ICD-10-CM

## 2016-02-28 ENCOUNTER — Telehealth: Payer: Self-pay | Admitting: Acute Care

## 2016-02-28 DIAGNOSIS — Z87891 Personal history of nicotine dependence: Secondary | ICD-10-CM

## 2016-02-28 NOTE — Telephone Encounter (Signed)
I have called the results of the low-dose screening CT to Charles Myers. I explained that his scan was read as a lung RADS category 2 indicating nodules that are benign in appearance and behavior. Recommendation per radiology is for repeat annual screening in 12 months.  I told him that we will order and schedule his annual scan for October 2018. 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 He told me that this is not a new finding, and that he is currently taking a statin. Charles Myers verbalized understanding of the above and had no further questions at the end of the call. I did let him know that I would send the results of the scan to his primary care physician Dr. London Pepper.

## 2016-06-09 MED FILL — ATORVASTATIN 40 MG TABLET: 40 | 90 days supply | Qty: 90 | Fill #1

## 2016-07-07 DIAGNOSIS — H52203 Unspecified astigmatism, bilateral: Secondary | ICD-10-CM | POA: Diagnosis not present

## 2016-07-07 DIAGNOSIS — H5213 Myopia, bilateral: Secondary | ICD-10-CM | POA: Diagnosis not present

## 2016-09-16 MED FILL — AMOXICILLIN 500 MG CAPSULE: 500 | 7 days supply | Qty: 21 | Fill #0

## 2016-09-17 DIAGNOSIS — Z125 Encounter for screening for malignant neoplasm of prostate: Secondary | ICD-10-CM | POA: Diagnosis not present

## 2016-09-17 DIAGNOSIS — Z0000 Encounter for general adult medical examination without abnormal findings: Secondary | ICD-10-CM | POA: Diagnosis not present

## 2016-09-17 DIAGNOSIS — E785 Hyperlipidemia, unspecified: Secondary | ICD-10-CM | POA: Diagnosis not present

## 2016-09-25 MED FILL — traMADol HCL 50 MG TABS: 50 | 4 days supply | Qty: 16 | Fill #0

## 2016-10-12 MED FILL — ATORVASTATIN 40 MG TABLET: 40 | 90 days supply | Qty: 90 | Fill #2

## 2017-02-22 ENCOUNTER — Ambulatory Visit: Payer: Self-pay | Admitting: Surgery

## 2017-02-22 DIAGNOSIS — D1722 Benign lipomatous neoplasm of skin and subcutaneous tissue of left arm: Secondary | ICD-10-CM | POA: Diagnosis not present

## 2017-02-22 MED FILL — PEG-3350 SOLUTION: 420 | 1 days supply | Qty: 4000 | Fill #0

## 2017-02-22 NOTE — H&P (Addendum)
History of Present Illness Charles Myers. Charles Knutzen MD; 02/22/2017 10:15 AM) The patient is a 61 year old male who presents with a complaint of Mass. PCP Dr. London Pepper  This is a 61 year old male who is status post excision of a very large lipoma of the left arm on 07/27/14. The patient has another smaller lipoma on his left shoulder just over the acromion. This has enlarged slightly. It causes minimal discomfort. He would like to have this removed while still fairly small since the other lipoma became very large. No other changes in his medical status.   Problem List/Past Medical Rodman Key K. Shauntelle Jamerson, MD; 02/22/2017 10:15 AM) LIPOMA OF LEFT SHOULDER (D17.22)  Past Surgical History Rodman Key K. Tonimarie Gritz, MD; 02/22/2017 10:15 AM) Colon Polyp Removal - Colonoscopy Oral Surgery Vasectomy  Diagnostic Studies History Charles Myers. Tamara Monteith, MD; 02/22/2017 10:15 AM) Colonoscopy 1-5 years ago  Allergies Rodman Key K. Siara Gorder, MD; 02/22/2017 10:15 AM) No Known Drug Allergies 06/29/2014  Medication History ([name], CMA; 02/22/2017 9:00 AM) Atorvastatin Calcium (40MG  Tablet, Oral) Active. No Current Medications (Taken starting 02/22/2017) Aspirin (81MG  Tablet DR, Oral) Active. Multivitamin Adults (Oral) Active.  Social History Charles Myers. Mohamud Mrozek, MD; 02/22/2017 10:15 AM) Alcohol use Moderate alcohol use. Caffeine use Coffee. No drug use Tobacco use Former smoker.  Family History Charles Myers. Danyah Guastella, MD; 02/22/2017 10:15 AM) Breast Cancer Sister. Colon Cancer Father. Family history unknown First Degree Relatives Heart disease in male family member before age 59 Melanoma Father. Prostate Cancer Father. Thyroid problems Mother.  Other Problems Charles Myers. Jerilee Space, MD; 02/22/2017 10:15 AM) Gastroesophageal Reflux Disease    Vitals ([name] CMA; 02/22/2017 9:01 AM) 02/22/2017 9:00 AM Weight: 200.13 lb Height: 73in Body Surface Area: 2.15 m Body Mass Index:  26.4 kg/m  Pulse: 74 (Regular)  BP: 120/70 (Sitting, Left Arm, Standard)      Physical Exam Rodman Key K. Talmadge Ganas MD; 02/22/2017 10:16 AM)  The physical exam findings are as follows: Note:Well-developed well-nourished in no apparent distress Superior left shoulder there is a smooth, mobile, 3 cm subcutaneous mass. No overlying skin changes. Minimal tenderness with palpation.    Assessment & Plan Charles Myers. Dontel Harshberger MD; 02/22/2017 9:18 AM)  LIPOMA OF LEFT SHOULDER (D17.22) Impression: 3 cm; anterior left shoulder  Current Plans Schedule for Surgery - Excision of subcutaneous lipoma - left shoulder. The surgical procedure has been discussed with the patient. Potential risks, benefits, alternative treatments, and expected outcomes have been explained. All of the patient's questions at this time have been answered. The likelihood of reaching the patient's treatment goal is good. The patient understand the proposed surgical procedure and wishes to proceed.  Charles Myers. [name], MD, [facility]  General/ Trauma Surgery  02/22/2017 10:16 AM

## 2017-02-25 ENCOUNTER — Encounter (HOSPITAL_BASED_OUTPATIENT_CLINIC_OR_DEPARTMENT_OTHER): Payer: Self-pay | Admitting: *Deleted

## 2017-03-02 NOTE — Progress Notes (Addendum)
Ensure pre surgery drink given with instructions to complete by 0915 dos, pt verbalized understanding. 

## 2017-03-03 ENCOUNTER — Ambulatory Visit (HOSPITAL_BASED_OUTPATIENT_CLINIC_OR_DEPARTMENT_OTHER)
Admission: RE | Admit: 2017-03-03 | Discharge: 2017-03-03 | Disposition: A | Discharge status: Home / Self Care | Payer: 59 | Source: Ambulatory Visit | Attending: Surgery | Admitting: Surgery

## 2017-03-03 ENCOUNTER — Encounter (HOSPITAL_BASED_OUTPATIENT_CLINIC_OR_DEPARTMENT_OTHER): Payer: Self-pay | Admitting: *Deleted

## 2017-03-03 ENCOUNTER — Ambulatory Visit (HOSPITAL_BASED_OUTPATIENT_CLINIC_OR_DEPARTMENT_OTHER): Payer: 59 | Admitting: Anesthesiology

## 2017-03-03 ENCOUNTER — Ambulatory Visit: Payer: 59

## 2017-03-03 ENCOUNTER — Encounter (HOSPITAL_BASED_OUTPATIENT_CLINIC_OR_DEPARTMENT_OTHER)
Admission: RE | Disposition: A | Discharge status: Home / Self Care | Payer: Self-pay | Source: Ambulatory Visit | Attending: Surgery

## 2017-03-03 DIAGNOSIS — F419 Anxiety disorder, unspecified: Secondary | ICD-10-CM | POA: Insufficient documentation

## 2017-03-03 DIAGNOSIS — Z7982 Long term (current) use of aspirin: Secondary | ICD-10-CM | POA: Diagnosis not present

## 2017-03-03 DIAGNOSIS — E785 Hyperlipidemia, unspecified: Secondary | ICD-10-CM | POA: Insufficient documentation

## 2017-03-03 DIAGNOSIS — Z79899 Other long term (current) drug therapy: Secondary | ICD-10-CM | POA: Diagnosis not present

## 2017-03-03 DIAGNOSIS — Z87891 Personal history of nicotine dependence: Secondary | ICD-10-CM | POA: Insufficient documentation

## 2017-03-03 DIAGNOSIS — D1722 Benign lipomatous neoplasm of skin and subcutaneous tissue of left arm: Secondary | ICD-10-CM | POA: Insufficient documentation

## 2017-03-03 DIAGNOSIS — K219 Gastro-esophageal reflux disease without esophagitis: Secondary | ICD-10-CM | POA: Insufficient documentation

## 2017-03-03 DIAGNOSIS — D170 Benign lipomatous neoplasm of skin and subcutaneous tissue of head, face and neck: Secondary | ICD-10-CM | POA: Diagnosis not present

## 2017-03-03 HISTORY — PX: LIPOMA EXCISION: SHX5283

## 2017-03-03 SURGERY — EXCISION LIPOMA
Anesthesia: Monitor Anesthesia Care | Site: Shoulder

## 2017-03-03 MED ORDER — CELECOXIB 200 MG PO CAPS
200.0000 mg | ORAL_CAPSULE | ORAL | Status: AC
  Administered 2017-03-03: 200 mg via ORAL

## 2017-03-03 MED ORDER — CHLORHEXIDINE GLUCONATE CLOTH 2 % EX PADS: 6.0000 | MEDICATED_PAD | Freq: Once | CUTANEOUS | Status: DC

## 2017-03-03 MED ORDER — CELECOXIB 200 MG PO CAPS
ORAL_CAPSULE | ORAL | Status: AC
  Filled 2017-03-03: qty 1

## 2017-03-03 MED ORDER — PROPOFOL 500 MG/50ML IV EMUL
INTRAVENOUS | Status: DC | PRN
  Administered 2017-03-03: 100 ug/kg/min via INTRAVENOUS

## 2017-03-03 MED ORDER — ONDANSETRON HCL 4 MG/2ML IJ SOLN
INTRAMUSCULAR | Status: DC | PRN
  Administered 2017-03-03: 4 mg via INTRAVENOUS

## 2017-03-03 MED ORDER — FENTANYL CITRATE (PF) 100 MCG/2ML IJ SOLN
50.0000 ug | INTRAMUSCULAR | Status: DC | PRN
  Administered 2017-03-03: 100 ug via INTRAVENOUS

## 2017-03-03 MED ORDER — BUPIVACAINE-EPINEPHRINE (PF) 0.25% -1:200000 IJ SOLN
INTRAMUSCULAR | Status: AC
  Filled 2017-03-03: qty 30

## 2017-03-03 MED ORDER — CEFAZOLIN SODIUM-DEXTROSE 2-4 GM/100ML-% IV SOLN
2.0000 g | INTRAVENOUS | Status: AC
  Administered 2017-03-03: 2 g via INTRAVENOUS

## 2017-03-03 MED ORDER — PROPOFOL 500 MG/50ML IV EMUL
INTRAVENOUS | Status: AC
  Filled 2017-03-03: qty 50

## 2017-03-03 MED ORDER — GABAPENTIN 300 MG PO CAPS
300.0000 mg | ORAL_CAPSULE | ORAL | Status: AC
  Administered 2017-03-03: 300 mg via ORAL

## 2017-03-03 MED ORDER — OXYCODONE HCL 5 MG PO TABS
5.0000 mg | ORAL_TABLET | Freq: Once | ORAL | Status: DC | PRN
Start: 2017-03-03 — End: 2017-03-03

## 2017-03-03 MED ORDER — PROMETHAZINE HCL 25 MG/ML IJ SOLN: 6.2500 mg | INTRAMUSCULAR | Status: DC | PRN

## 2017-03-03 MED ORDER — LACTATED RINGERS IV SOLN
INTRAVENOUS | Status: DC
  Administered 2017-03-03 (×2): via INTRAVENOUS

## 2017-03-03 MED ORDER — MIDAZOLAM HCL 2 MG/2ML IJ SOLN
INTRAMUSCULAR | Status: AC
  Filled 2017-03-03: qty 2

## 2017-03-03 MED ORDER — PROPOFOL 10 MG/ML IV BOLUS
INTRAVENOUS | Status: AC
  Filled 2017-03-03: qty 20

## 2017-03-03 MED ORDER — BUPIVACAINE-EPINEPHRINE 0.25% -1:200000 IJ SOLN
INTRAMUSCULAR | Status: DC | PRN
  Administered 2017-03-03: 10 mL

## 2017-03-03 MED ORDER — MEPERIDINE HCL 25 MG/ML IJ SOLN: 6.2500 mg | INTRAMUSCULAR | Status: DC | PRN

## 2017-03-03 MED ORDER — ACETAMINOPHEN 500 MG PO TABS
1000.0000 mg | ORAL_TABLET | ORAL | Status: AC
  Administered 2017-03-03: 1000 mg via ORAL

## 2017-03-03 MED ORDER — GABAPENTIN 300 MG PO CAPS
ORAL_CAPSULE | ORAL | Status: AC
  Filled 2017-03-03: qty 1

## 2017-03-03 MED ORDER — LIDOCAINE HCL (CARDIAC) 20 MG/ML IV SOLN
INTRAVENOUS | Status: DC | PRN
  Administered 2017-03-03: 30 mg via INTRAVENOUS

## 2017-03-03 MED ORDER — ACETAMINOPHEN 500 MG PO TABS
ORAL_TABLET | ORAL | Status: AC
  Filled 2017-03-03: qty 2

## 2017-03-03 MED ORDER — CEFAZOLIN SODIUM-DEXTROSE 2-4 GM/100ML-% IV SOLN
INTRAVENOUS | Status: AC
Start: 2017-03-03 — End: ?
  Filled 2017-03-03: qty 100

## 2017-03-03 MED ORDER — PROPOFOL 10 MG/ML IV BOLUS
INTRAVENOUS | Status: DC | PRN
  Administered 2017-03-03 (×3): 20 mg via INTRAVENOUS

## 2017-03-03 MED ORDER — HYDROMORPHONE HCL 1 MG/ML IJ SOLN: 0.2500 mg | INTRAMUSCULAR | Status: DC | PRN

## 2017-03-03 MED ORDER — OXYCODONE HCL 5 MG/5ML PO SOLN: 5.0000 mg | Freq: Once | ORAL | Status: DC | PRN

## 2017-03-03 MED ORDER — SCOPOLAMINE 1 MG/3DAYS TD PT72: 1.0000 | MEDICATED_PATCH | Freq: Once | TRANSDERMAL | Status: DC | PRN

## 2017-03-03 MED ORDER — FENTANYL CITRATE (PF) 100 MCG/2ML IJ SOLN
INTRAMUSCULAR | Status: AC
  Filled 2017-03-03: qty 2

## 2017-03-03 MED ORDER — MIDAZOLAM HCL 2 MG/2ML IJ SOLN
1.0000 mg | INTRAMUSCULAR | Status: DC | PRN
  Administered 2017-03-03: 2 mg via INTRAVENOUS

## 2017-03-03 SURGICAL SUPPLY — 50 items
BENZOIN TINCTURE PRP APPL 2/3 (GAUZE/BANDAGES/DRESSINGS) ×3 IMPLANT
BLADE CLIPPER SURG (BLADE) ×3 IMPLANT
BLADE SURG 15 STRL LF DISP TIS (BLADE) ×1 IMPLANT
BLADE SURG 15 STRL SS (BLADE) ×2
CANISTER SUCT 1200ML W/VALVE (MISCELLANEOUS) ×3 IMPLANT
CHLORAPREP W/TINT 26ML (MISCELLANEOUS) ×3 IMPLANT
CLOSURE WOUND 1/2 X4 (GAUZE/BANDAGES/DRESSINGS) ×1
COVER BACK TABLE 60X90IN (DRAPES) ×3 IMPLANT
COVER MAYO STAND STRL (DRAPES) ×3 IMPLANT
DECANTER SPIKE VIAL GLASS SM (MISCELLANEOUS) IMPLANT
DRAPE LAPAROTOMY 100X72 PEDS (DRAPES) ×3 IMPLANT
DRAPE UTILITY XL STRL (DRAPES) ×3 IMPLANT
DRSG TEGADERM 2-3/8X2-3/4 SM (GAUZE/BANDAGES/DRESSINGS) ×6 IMPLANT
DRSG TEGADERM 4X4.75 (GAUZE/BANDAGES/DRESSINGS) IMPLANT
ELECT COATED BLADE 2.86 ST (ELECTRODE) ×3 IMPLANT
ELECT REM PT RETURN 9FT ADLT (ELECTROSURGICAL) ×3
ELECTRODE REM PT RTRN 9FT ADLT (ELECTROSURGICAL) ×1 IMPLANT
GAUZE SPONGE 4X4 12PLY STRL LF (GAUZE/BANDAGES/DRESSINGS) IMPLANT
GLOVE BIO SURGEON STRL SZ 6.5 (GLOVE) ×4 IMPLANT
GLOVE BIO SURGEON STRL SZ7 (GLOVE) ×6 IMPLANT
GLOVE BIO SURGEONS STRL SZ 6.5 (GLOVE) ×2
GLOVE BIOGEL PI IND STRL 7.0 (GLOVE) ×2 IMPLANT
GLOVE BIOGEL PI IND STRL 7.5 (GLOVE) ×1 IMPLANT
GLOVE BIOGEL PI INDICATOR 7.0 (GLOVE) ×4
GLOVE BIOGEL PI INDICATOR 7.5 (GLOVE) ×2
GOWN STRL REUS W/ TWL LRG LVL3 (GOWN DISPOSABLE) ×2 IMPLANT
GOWN STRL REUS W/ TWL XL LVL3 (GOWN DISPOSABLE) ×1 IMPLANT
GOWN STRL REUS W/TWL LRG LVL3 (GOWN DISPOSABLE) ×4
GOWN STRL REUS W/TWL XL LVL3 (GOWN DISPOSABLE) ×2
NEEDLE HYPO 25X1 1.5 SAFETY (NEEDLE) ×3 IMPLANT
NS IRRIG 1000ML POUR BTL (IV SOLUTION) IMPLANT
PACK BASIN DAY SURGERY FS (CUSTOM PROCEDURE TRAY) ×3 IMPLANT
PENCIL BUTTON HOLSTER BLD 10FT (ELECTRODE) ×3 IMPLANT
SLEEVE SCD COMPRESS KNEE MED (MISCELLANEOUS) IMPLANT
SPONGE GAUZE 2X2 8PLY STER LF (GAUZE/BANDAGES/DRESSINGS)
SPONGE GAUZE 2X2 8PLY STRL LF (GAUZE/BANDAGES/DRESSINGS) IMPLANT
STRIP CLOSURE SKIN 1/2X4 (GAUZE/BANDAGES/DRESSINGS) ×2 IMPLANT
SUT MON AB 4-0 PC3 18 (SUTURE) ×3 IMPLANT
SUT PROLENE 6 0 P 1 18 (SUTURE) IMPLANT
SUT SILK 2 0 PERMA HAND 18 BK (SUTURE) IMPLANT
SUT VIC AB 3-0 SH 27 (SUTURE)
SUT VIC AB 3-0 SH 27X BRD (SUTURE) IMPLANT
SUT VICRYL 3-0 CR8 SH (SUTURE) IMPLANT
SYR BULB 3OZ (MISCELLANEOUS) IMPLANT
SYR CONTROL 10ML LL (SYRINGE) ×3 IMPLANT
TOWEL OR 17X24 6PK STRL BLUE (TOWEL DISPOSABLE) ×3 IMPLANT
TOWEL OR NON WOVEN STRL DISP B (DISPOSABLE) ×3 IMPLANT
TUBE CONNECTING 20'X1/4 (TUBING) ×1
TUBE CONNECTING 20X1/4 (TUBING) ×2 IMPLANT
YANKAUER SUCT BULB TIP NO VENT (SUCTIONS) ×3 IMPLANT

## 2017-03-03 NOTE — Op Note (Signed)
Preop diagnosis: Subcutaneous lipomas of the left shoulder and left neck (both 2 cm or less) Postop diagnosis: Same Procedure performed: Excision of subcutaneous lipomas of the left shoulder left neck Surgeon:Kammi Hechler K. Anesthesia: Local MAC Indications: This is a 61 year old male who presents with 2 slowly enlarging masses on his left shoulder and left neck.  This of cause some tenderness.  He has had a previous very large lipoma removed from his left arm.  He would like to have these 2 smaller lipomas excised for the get larger.  Scription of procedure: The patient is brought to the operating room and placed in the lateral position on the operating room table.  He was given some intravenous sedation.  The left side of his neck and his left shoulder were prepped with ChloraPrep and draped in sterile fashion.  A timeout was taken to ensure the proper patient and proper procedure.  We will infiltrate the area over each palpable mass with 0.25% Marcaine with epinephrine.  I made a transverse incision over the mass at the left shoulder.  We dissected down through the subcutaneous tissue to the surface of the lipoma.  We bluntly dissected around the lipoma.  It was densely adherent to the underlying muscle.  We excised the entire mass off of the underlying muscle.  This was sent for pathologic examination.  Cautery was used for hemostasis.  We turned our attention to the smaller one on the left posterior neck.  I made a transverse incision over this area.  We dissected through the dermis to the surface of the lipoma.  We will remove the entire lipoma which was also adherent to the underlying muscle.  Inspected for hemostasis.  Both incisions were closed with a deep layer of 3-0 Vicryl and a subcuticular layer of 4-0 Monocryl.  Benzoin and Steri-Strips were applied.  Occlusive dressings were applied.  The patient was then extubated and brought to recovery room in stable condition.  All sponge, instrument,  and needle counts are correct.  Imogene Burn. [name], MD, [facility]  General/ Trauma Surgery  03/03/2017 1:49 PM

## 2017-03-03 NOTE — Discharge Instructions (Signed)
[facility] °Office Phone Number [phone] ° °Lipoma Excision: POST OP INSTRUCTIONS ° °Always review your discharge instruction sheet given to you by the facility where your surgery was performed. ° °IF YOU HAVE DISABILITY OR FAMILY LEAVE FORMS, YOU MUST BRING THEM TO THE OFFICE FOR PROCESSING.  DO NOT GIVE THEM TO YOUR DOCTOR. ° °1. A prescription for pain medication may be given to you upon discharge.  Take your pain medication as prescribed, if needed.  If narcotic pain medicine is not needed, then you may take acetaminophen (Tylenol) or ibuprofen (Advil) as needed. °2. Take your usually prescribed medications unless otherwise directed °3. If you need a refill on your pain medication, please contact your pharmacy.  They will contact our office to request authorization.  Prescriptions will not be filled after 5pm or on week-ends. °4. You should eat very light the first 24 hours after surgery, such as soup, crackers, pudding, etc.  Resume your normal diet the day after surgery. °5. Most patients will experience some swelling and bruising around the surgical site.  Ice packs will help.  Swelling and bruising can take several days to resolve.  °6. It is common to experience some constipation if taking pain medication after surgery.  Increasing fluid intake and taking a stool softener will usually help or prevent this problem from occurring.  A mild laxative (Milk of Magnesia or Miralax) should be taken according to package directions if there are no bowel movements after 48 hours. °7. You may remove your bandages 48 hours after surgery, and you may shower at that time.  You will have steri-strips (small skin tapes) in place directly over the incision.  These strips should be left on the skin for 7-10 days.   °8. ACTIVITIES:  You may resume regular daily activities (gradually increasing) beginning the next day.   You may have sexual intercourse when it is comfortable. °a. You may drive when you no  longer are taking prescription pain medication, you can comfortably wear a seatbelt, and you can safely maneuver your car and apply brakes. °b. RETURN TO WORK:  1-2 weeks °9. You should see your doctor in the office for a follow-up appointment approximately two to three weeks after your surgery.   ° °WHEN TO CALL YOUR DOCTOR: °1. Fever over 101.0 °2. Nausea and/or vomiting. °3. Extreme swelling or bruising. °4. Continued bleeding from incision. °5. Increased pain, redness, or drainage from the incision. ° °The clinic staff is available to answer your questions during regular business hours.  Please don’t hesitate to call and ask to speak to one of the nurses for clinical concerns.  If you have a medical emergency, go to the nearest emergency room or call 911.  A surgeon from [facility] is always on call at the hospital. ° °For further questions, please visit [email or web address]  ° ° ° ° ° °Post Anesthesia Home Care Instructions ° °Activity: °Get plenty of rest for the remainder of the day. A responsible individual must stay with you for 24 hours following the procedure.  °For the next 24 hours, DO NOT: °-Drive a car °-Operate machinery °-Drink alcoholic beverages °-Take any medication unless instructed by your physician °-Make any legal decisions or sign important papers. ° °Meals: °Start with liquid foods such as gelatin or soup. Progress to regular foods as tolerated. Avoid greasy, spicy, heavy foods. If nausea and/or vomiting occur, drink only clear liquids until the nausea and/or vomiting subsides. Call your physician if vomiting continues. ° °  Special Instructions/Symptoms: °Your throat may feel dry or sore from the anesthesia or the breathing tube placed in your throat during surgery. If this causes discomfort, gargle with warm salt water. The discomfort should disappear within 24 hours. ° °If you had a scopolamine patch placed behind your ear for the management of post- operative nausea  and/or vomiting: ° °1. The medication in the patch is effective for 72 hours, after which it should be removed.  Wrap patch in a tissue and discard in the trash. Wash hands thoroughly with soap and water. °2. You may remove the patch earlier than 72 hours if you experience unpleasant side effects which may include dry mouth, dizziness or visual disturbances. °3. Avoid touching the patch. Wash your hands with soap and water after contact with the patch. °  ° °

## 2017-03-03 NOTE — Interval H&P Note (Signed)
History and Physical Interval Note:  03/03/2017 12:24 PM  The patient also has a lipoma on the left side of the neck that we will also remove.  Charles Myers  has presented today for surgery, with the diagnosis of LIPOMA OF LEFT SHOULDER AND BACK OF NECK  The various methods of treatment have been discussed with the patient and family. After consideration of risks, benefits and other options for treatment, the patient has consented to  Procedure(s): EXCISION OF LIPOMAS OF LEFT SHOULDER AND BACK OF NECK (N/A) as a surgical intervention .  The patient's history has been reviewed, patient examined, no change in status, stable for surgery.  I have reviewed the patient's chart and labs.  Questions were answered to the patient's satisfaction.     [name] K.

## 2017-03-03 NOTE — Transfer of Care (Signed)
Immediate Anesthesia Transfer of Care Note  Patient: Charles Myers  Procedure(s) Performed: EXCISION OF LIPOMAS OF LEFT SHOULDER AND BACK OF NECK (N/A Shoulder)  Patient Location: PACU  Anesthesia Type:MAC  Level of Consciousness: sedated  Airway & Oxygen Therapy: Patient Spontanous Breathing and Patient connected to face mask oxygen  Post-op Assessment: Report given to RN and Post -op Vital signs reviewed and stable  Post vital signs: Reviewed and stable  Last Vitals:  Vitals:   03/03/17 1150  BP: 116/65  Pulse: 71  Resp: 18  Temp: 36.7 C  SpO2: 97%    Last Pain:  Vitals:   03/03/17 1150  TempSrc: Oral         Complications: No apparent anesthesia complications

## 2017-03-03 NOTE — Anesthesia Preprocedure Evaluation (Addendum)
Anesthesia Evaluation  Patient identified by MRN, date of birth, ID band Patient awake    Reviewed: Allergy & Precautions, NPO status , Patient's Chart, lab work & pertinent test results  Airway Mallampati: II  TM Distance: >3 FB Neck ROM: Full    Dental no notable dental hx. (+) Teeth Intact, Dental Advisory Given   Pulmonary former smoker,    Pulmonary exam normal breath sounds clear to auscultation       Cardiovascular Normal cardiovascular exam Rhythm:Regular Rate:Normal  HLD   Neuro/Psych PSYCHIATRIC DISORDERS Anxiety negative neurological ROS     GI/Hepatic negative GI ROS, Neg liver ROS,   Endo/Other  negative endocrine ROS  Renal/GU negative Renal ROS     Musculoskeletal negative musculoskeletal ROS (+)   Abdominal   Peds  Hematology negative hematology ROS (+)   Anesthesia Other Findings   Reproductive/Obstetrics                            Anesthesia Physical  Anesthesia Plan  ASA: II  Anesthesia Plan: MAC   Post-op Pain Management:    Induction: Intravenous  PONV Risk Score and Plan: 2 and Ondansetron and Midazolam  Airway Management Planned: Simple Face Mask  Additional Equipment: None  Intra-op Plan:   Post-operative Plan:   Informed Consent: I have reviewed the patients History and Physical, chart, labs and discussed the procedure including the risks, benefits and alternatives for the proposed anesthesia with the patient or authorized representative who has indicated his/her understanding and acceptance.   Dental advisory given  Plan Discussed with: CRNA  Anesthesia Plan Comments:        Anesthesia Quick Evaluation

## 2017-03-03 NOTE — H&P (View-Only) (Signed)
History of Present Illness Charles Myers. Charles Tome MD; 02/22/2017 10:15 AM) The patient is a 61 year old male who presents with a complaint of Mass. PCP Dr. London Pepper  This is a 61 year old male who is status post excision of a very large lipoma of the left arm on 07/27/14. The patient has another smaller lipoma on his left shoulder just over the acromion. This has enlarged slightly. It causes minimal discomfort. He would like to have this removed while still fairly small since the other lipoma became very large. No other changes in his medical status.   Problem List/Past Medical Rodman Key K. 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 [name], MD; 02/22/2017 10:15 AM) Colon Polyp Removal - Colonoscopy Oral Surgery Vasectomy  Diagnostic Studies History Charles Myers. [name], MD; 02/22/2017 10:15 AM) Colonoscopy 1-5 years ago  Allergies Rodman Key K. [name], MD; 02/22/2017 10:15 AM) No Known Drug Allergies 06/29/2014  Medication History ([name], CMA; 02/22/2017 9:00 AM) Atorvastatin Calcium (40MG  Tablet, Oral) Active. No Current Medications (Taken starting 02/22/2017) Aspirin (81MG  Tablet DR, Oral) Active. Multivitamin Adults (Oral) Active.  Social History Charles Myers. [name], MD; 02/22/2017 10:15 AM) Alcohol use Moderate alcohol use. Caffeine use Coffee. No drug use Tobacco use Former smoker.  Family History Charles Myers. [name], MD; 02/22/2017 10:15 AM) Breast Cancer Sister. Colon Cancer Father. Family history unknown First Degree Relatives Heart disease in male family member before age 51 Melanoma Father. Prostate Cancer Father. Thyroid problems Mother.  Other Problems Charles Myers. [name], MD; 02/22/2017 10:15 AM) Gastroesophageal Reflux Disease    Vitals ([name] CMA; 02/22/2017 9:01 AM) 02/22/2017 9:00 AM Weight: 200.13 lb Height: 73in Body Surface Area: 2.15 m Body Mass Index:  26.4 kg/m  Pulse: 74 (Regular)  BP: 120/70 (Sitting, Left Arm, Standard)      Physical Exam Rodman Key K. Adisen Bennion MD; 02/22/2017 10:16 AM)  The physical exam findings are as follows: Note:Well-developed well-nourished in no apparent distress Superior left shoulder there is a smooth, mobile, 3 cm subcutaneous mass. No overlying skin changes. Minimal tenderness with palpation.    Assessment & Plan Charles Myers. 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 The surgical procedure has been discussed with the patient. Potential risks, benefits, alternative treatments, and expected outcomes have been explained. All of the patient's questions at this time have been answered. The likelihood of reaching the patient's treatment goal is good. The patient understand the proposed surgical procedure and wishes to proceed.  Charles Myers. [name], MD, [facility] Surgery  General/ Trauma Surgery  02/22/2017 10:16 AM

## 2017-03-03 NOTE — Anesthesia Postprocedure Evaluation (Signed)
Anesthesia Post Note  Patient: Charles Myers  Procedure(s) Performed: EXCISION OF LIPOMAS OF LEFT SHOULDER AND BACK OF NECK (N/A Shoulder)     Patient location during evaluation: PACU Anesthesia Type: MAC Level of consciousness: awake and alert Pain management: pain level controlled Vital Signs Assessment: post-procedure vital signs reviewed and stable Respiratory status: spontaneous breathing, nonlabored ventilation and respiratory function stable Cardiovascular status: stable and blood pressure returned to baseline Postop Assessment: no apparent nausea or vomiting Anesthetic complications: no    Last Vitals:  Vitals:   03/03/17 1415 03/03/17 1445  BP: 103/71 104/66  Pulse: 60 63  Resp: 17 18  Temp:  (!) 36.4 C  SpO2: 97% 98%    Last Pain:  Vitals:   03/03/17 1351  TempSrc:   PainSc: Asleep                 Catalina Gravel

## 2017-03-04 NOTE — Addendum Note (Signed)
Addendum  created 03/04/17 1232 by Maryella Shivers, CRNA   Charge Capture section accepted

## 2017-03-04 NOTE — Addendum Note (Signed)
Addendum  created 03/04/17 1230 by Maryella Shivers, CRNA   Charge Capture section accepted

## 2017-03-05 ENCOUNTER — Ambulatory Visit (INDEPENDENT_AMBULATORY_CARE_PROVIDER_SITE_OTHER)
Admission: RE | Admit: 2017-03-05 | Discharge: 2017-03-05 | Disposition: A | Discharge status: Home / Self Care | Payer: 59 | Source: Ambulatory Visit | Attending: Acute Care | Admitting: Acute Care

## 2017-03-05 ENCOUNTER — Encounter (HOSPITAL_BASED_OUTPATIENT_CLINIC_OR_DEPARTMENT_OTHER): Payer: Self-pay | Admitting: Surgery

## 2017-03-05 DIAGNOSIS — Z87891 Personal history of nicotine dependence: Secondary | ICD-10-CM

## 2017-03-11 ENCOUNTER — Other Ambulatory Visit: Payer: Self-pay | Admitting: Acute Care

## 2017-03-11 DIAGNOSIS — Z122 Encounter for screening for malignant neoplasm of respiratory organs: Secondary | ICD-10-CM

## 2017-03-11 DIAGNOSIS — Z87891 Personal history of nicotine dependence: Secondary | ICD-10-CM

## 2017-04-02 ENCOUNTER — Other Ambulatory Visit: Payer: Self-pay | Admitting: Cardiovascular Disease

## 2017-04-02 NOTE — Telephone Encounter (Signed)
Should this be deferred to patients pcp as he is prn follow up with Dr Acie Fredrickson? Please advise. Thanks, MI

## 2017-04-05 MED FILL — ATORVASTATIN 20 MG TABLET: 20 | 90 days supply | Qty: 90 | Fill #0

## 2017-04-15 DIAGNOSIS — Z86010 Personal history of colonic polyps: Secondary | ICD-10-CM | POA: Diagnosis not present

## 2017-04-15 DIAGNOSIS — K621 Rectal polyp: Secondary | ICD-10-CM | POA: Diagnosis not present

## 2017-04-15 DIAGNOSIS — Z800 Family history of malignant neoplasm of digestive organs: Secondary | ICD-10-CM | POA: Diagnosis not present

## 2017-04-15 DIAGNOSIS — D126 Benign neoplasm of colon, unspecified: Secondary | ICD-10-CM | POA: Diagnosis not present

## 2017-04-15 DIAGNOSIS — K5730 Diverticulosis of large intestine without perforation or abscess without bleeding: Secondary | ICD-10-CM | POA: Diagnosis not present

## 2017-04-19 MED FILL — SHINGRIX VIAL KIT: 50 | 1 days supply | Qty: 1 | Fill #0

## 2017-07-01 MED FILL — SHINGRIX 50 MCG SUS: 50 | 1 days supply | Qty: 1 | Fill #1

## 2017-09-20 DIAGNOSIS — Z1159 Encounter for screening for other viral diseases: Secondary | ICD-10-CM | POA: Diagnosis not present

## 2017-09-20 DIAGNOSIS — Z125 Encounter for screening for malignant neoplasm of prostate: Secondary | ICD-10-CM | POA: Diagnosis not present

## 2017-09-20 DIAGNOSIS — E785 Hyperlipidemia, unspecified: Secondary | ICD-10-CM | POA: Diagnosis not present

## 2017-09-20 DIAGNOSIS — Z0000 Encounter for general adult medical examination without abnormal findings: Secondary | ICD-10-CM | POA: Diagnosis not present

## 2018-01-10 MED FILL — ATORVASTATIN CALCIUM 20 MG: 20 | 30 days supply | Qty: 30 | Fill #1

## 2018-03-01 MED FILL — ATORVASTATIN CALCIUM 20 MG: 20 | 30 days supply | Qty: 30 | Fill #2

## 2018-03-11 DIAGNOSIS — Z23 Encounter for immunization: Secondary | ICD-10-CM | POA: Diagnosis not present

## 2018-03-16 ENCOUNTER — Ambulatory Visit (INDEPENDENT_AMBULATORY_CARE_PROVIDER_SITE_OTHER)
Admission: RE | Admit: 2018-03-16 | Discharge: 2018-03-16 | Disposition: A | Discharge status: Home / Self Care | Payer: Commercial Managed Care - PPO | Source: Ambulatory Visit | Attending: Acute Care | Admitting: Acute Care

## 2018-03-16 DIAGNOSIS — Z87891 Personal history of nicotine dependence: Secondary | ICD-10-CM | POA: Diagnosis not present

## 2018-03-16 DIAGNOSIS — Z122 Encounter for screening for malignant neoplasm of respiratory organs: Secondary | ICD-10-CM

## 2018-03-25 ENCOUNTER — Telehealth: Payer: Self-pay | Admitting: Acute Care

## 2018-03-25 DIAGNOSIS — Z122 Encounter for screening for malignant neoplasm of respiratory organs: Secondary | ICD-10-CM

## 2018-03-25 DIAGNOSIS — Z87891 Personal history of nicotine dependence: Secondary | ICD-10-CM

## 2018-03-25 NOTE — Telephone Encounter (Signed)
Pt's wife informed of CT results per Eric Form, NP.  Pt's wife verbalized understanding.  Copy sent to PCP.  Order placed for 1 yr f/u CT.

## 2018-04-13 MED FILL — ATORVASTATIN CALCIUM 20 MG: 20 | 30 days supply | Qty: 30 | Fill #0

## 2018-05-10 MED FILL — ATORVASTATIN CALCIUM 20 MG: 20 | 30 days supply | Qty: 30 | Fill #1

## 2018-06-14 MED FILL — ATORVASTATIN 20 MG TABLET: 20 | 30 days supply | Qty: 30 | Fill #2

## 2018-07-12 MED FILL — ATORVASTATIN 20 MG TABLET: 20 | 30 days supply | Qty: 30 | Fill #3 | Status: TO

## 2018-08-24 MED FILL — ATORVASTATIN 20 MG TABLET: 20 | 30 days supply | Qty: 30 | Fill #0 | Status: TO

## 2018-09-29 MED FILL — ATORVASTATIN 20 MG TABLET: 20 | 30 days supply | Qty: 30 | Fill #0

## 2018-10-24 MED FILL — ATORVASTATIN 20 MG TABLET: 20 | 30 days supply | Qty: 30 | Fill #0

## 2018-11-29 MED FILL — ATORVASTATIN 20 MG TABLET: 20 | 30 days supply | Qty: 30 | Fill #1

## 2018-12-23 MED FILL — ATORVASTATIN 20 MG TABLET: 20 | 30 days supply | Qty: 30 | Fill #2

## 2019-01-19 MED FILL — ATORVASTATIN 20 MG TABLET: 20 | 30 days supply | Qty: 30 | Fill #3

## 2019-02-21 MED FILL — ATORVASTATIN 20 MG TABLET: 20 | 30 days supply | Qty: 30 | Fill #4

## 2019-04-05 ENCOUNTER — Ambulatory Visit
Admission: RE | Admit: 2019-04-05 | Discharge: 2019-04-05 | Disposition: A | Discharge status: Home / Self Care | Payer: BC Managed Care – PPO | Source: Ambulatory Visit | Attending: Acute Care | Admitting: Acute Care

## 2019-04-05 DIAGNOSIS — Z122 Encounter for screening for malignant neoplasm of respiratory organs: Secondary | ICD-10-CM

## 2019-04-05 DIAGNOSIS — Z87891 Personal history of nicotine dependence: Secondary | ICD-10-CM

## 2019-04-17 ENCOUNTER — Encounter: Payer: Self-pay | Admitting: *Deleted

## 2019-04-17 ENCOUNTER — Other Ambulatory Visit: Payer: Self-pay | Admitting: *Deleted

## 2019-04-17 DIAGNOSIS — Z87891 Personal history of nicotine dependence: Secondary | ICD-10-CM

## 2019-05-08 MED FILL — ATORVASTATIN 20 MG TABLET: 20 | 90 days supply | Qty: 90 | Fill #5

## 2019-07-25 MED FILL — ATORVASTATIN 20 MG TABLET: 20 | 90 days supply | Qty: 90 | Fill #6

## 2019-07-27 ENCOUNTER — Ambulatory Visit: Payer: Self-pay | Attending: Internal Medicine

## 2019-07-27 DIAGNOSIS — Z23 Encounter for immunization: Secondary | ICD-10-CM

## 2019-07-27 NOTE — Progress Notes (Signed)
   Covid-19 Vaccination Clinic  Name:  ROSELL FASOLINO    MRN: [id number] DOB: 1955/05/22  07/27/2019  Mr. Johnke was observed post Covid-19 immunization for 15 minutes without incident. He was provided with Vaccine Information Sheet and instruction to access the V-Safe system.   Mr. Serratore was instructed to call 911 with any severe reactions post vaccine: Marland Kitchen Difficulty breathing  . Swelling of face and throat  . A fast heartbeat  . A bad rash all over body  . Dizziness and weakness   Immunizations Administered    Name Date Dose VIS Date Route   Pfizer COVID-19 Vaccine 07/27/2019 10:13 AM 0.3 mL 04/14/2019 Intramuscular   Manufacturer: [place]   Lot: [id number]   [place]: [id number]

## 2019-08-23 ENCOUNTER — Ambulatory Visit: Payer: Self-pay

## 2019-08-30 ENCOUNTER — Ambulatory Visit: Payer: Self-pay | Attending: Internal Medicine

## 2019-08-30 DIAGNOSIS — Z23 Encounter for immunization: Secondary | ICD-10-CM

## 2019-08-30 NOTE — Progress Notes (Signed)
   Covid-19 Vaccination Clinic  Name:  CHRITOPHER UMANA    MRN: [id number] DOB: 07/05/1955  08/30/2019  Mr. Langrehr was observed post Covid-19 immunization for 15 minutes without incident. He was provided with Vaccine Information Sheet and instruction to access the V-Safe system.   Mr. Sellards was instructed to call 911 with any severe reactions post vaccine: Marland Kitchen Difficulty breathing  . Swelling of face and throat  . A fast heartbeat  . A bad rash all over body  . Dizziness and weakness   Immunizations Administered    Name Date Dose VIS Date Route   Pfizer COVID-19 Vaccine 08/30/2019  2:46 PM 0.3 mL 06/28/2018 Intramuscular   Manufacturer: [place]   Lot: [id number]   [place]: [id number]

## 2019-10-03 ENCOUNTER — Other Ambulatory Visit: Payer: Self-pay | Admitting: Family Medicine

## 2019-10-03 DIAGNOSIS — N644 Mastodynia: Secondary | ICD-10-CM

## 2019-11-28 ENCOUNTER — Other Ambulatory Visit (HOSPITAL_COMMUNITY): Payer: Self-pay | Admitting: Family Medicine

## 2019-11-28 MED FILL — ATORVASTATIN 20 MG TABLET: 20 | 90 days supply | Qty: 90 | Fill #0

## 2020-01-15 MED FILL — AMOX-CLAV 875-125 MG TABLET: 875-125 | 7 days supply | Qty: 14 | Fill #0

## 2020-03-05 MED FILL — ATORVASTATIN 20 MG TABLET: 20 | 90 days supply | Qty: 90 | Fill #1

## 2020-04-11 ENCOUNTER — Ambulatory Visit
Admission: RE | Admit: 2020-04-11 | Discharge: 2020-04-11 | Disposition: A | Discharge status: Home / Self Care | Payer: 59 | Source: Ambulatory Visit | Attending: Acute Care | Admitting: Acute Care

## 2020-04-11 ENCOUNTER — Other Ambulatory Visit: Payer: Self-pay

## 2020-04-11 DIAGNOSIS — Z87891 Personal history of nicotine dependence: Secondary | ICD-10-CM

## 2020-04-18 ENCOUNTER — Other Ambulatory Visit: Payer: Self-pay | Admitting: *Deleted

## 2020-04-18 DIAGNOSIS — Z87891 Personal history of nicotine dependence: Secondary | ICD-10-CM

## 2020-04-18 NOTE — Progress Notes (Signed)
Please call patient and let them  know their  low dose Ct was read as a Lung RADS 2: nodules that are benign in appearance and behavior with a very low likelihood of becoming a clinically active cancer due to size or lack of growth. Recommendation per radiology is for a repeat LDCT in 12 months. .Please let them  know we will order and schedule their  annual screening scan for 04/2021. Please let them  know there was notation of CAD on their  scan.  Please remind the patient  that this is a non-gated exam therefore degree or severity of disease  cannot be determined. Please have them  follow up with their PCP regarding potential risk factor modification, dietary therapy or pharmacologic therapy if clinically indicated. Pt.  is  currently on statin therapy. Please place order for annual  screening scan for  04/2021 and fax results to PCP. Thanks so much. 

## 2020-06-11 MED FILL — ATORVASTATIN CALCIUM 20 MG: 20 | 90 days supply | Qty: 90 | Fill #2

## 2020-07-18 ENCOUNTER — Other Ambulatory Visit (HOSPITAL_COMMUNITY): Payer: Self-pay | Admitting: Dentistry

## 2020-09-16 ENCOUNTER — Other Ambulatory Visit (HOSPITAL_COMMUNITY): Payer: Self-pay

## 2020-09-16 MED FILL — Atorvastatin Calcium Tab 20 MG (Base Equivalent): ORAL | 90 days supply | Qty: 90 | Fill #0 | Status: AC

## 2020-09-17 ENCOUNTER — Other Ambulatory Visit (HOSPITAL_COMMUNITY): Payer: Self-pay

## 2020-12-24 ENCOUNTER — Other Ambulatory Visit (HOSPITAL_COMMUNITY): Payer: Self-pay

## 2021-04-16 ENCOUNTER — Other Ambulatory Visit: Payer: Self-pay | Admitting: Acute Care

## 2021-04-16 DIAGNOSIS — Z87891 Personal history of nicotine dependence: Secondary | ICD-10-CM

## 2021-06-18 ENCOUNTER — Ambulatory Visit
Admission: RE | Admit: 2021-06-18 | Discharge: 2021-06-18 | Disposition: A | Discharge status: Home / Self Care | Payer: 59 | Source: Ambulatory Visit | Attending: Acute Care | Admitting: Acute Care

## 2021-06-18 DIAGNOSIS — Z87891 Personal history of nicotine dependence: Secondary | ICD-10-CM

## 2021-06-20 ENCOUNTER — Other Ambulatory Visit: Payer: Self-pay

## 2021-06-20 DIAGNOSIS — Z87891 Personal history of nicotine dependence: Secondary | ICD-10-CM

## 2021-09-08 ENCOUNTER — Other Ambulatory Visit (HOSPITAL_COMMUNITY): Payer: Self-pay | Admitting: Family Medicine

## 2021-09-08 DIAGNOSIS — E785 Hyperlipidemia, unspecified: Secondary | ICD-10-CM

## 2021-09-22 ENCOUNTER — Encounter (HOSPITAL_BASED_OUTPATIENT_CLINIC_OR_DEPARTMENT_OTHER): Payer: Self-pay

## 2021-09-22 ENCOUNTER — Ambulatory Visit (HOSPITAL_BASED_OUTPATIENT_CLINIC_OR_DEPARTMENT_OTHER)
Admission: RE | Admit: 2021-09-22 | Discharge: 2021-09-22 | Disposition: A | Discharge status: Home / Self Care | Payer: Medicare Other | Source: Ambulatory Visit | Attending: Family Medicine | Admitting: Family Medicine

## 2021-09-22 DIAGNOSIS — E785 Hyperlipidemia, unspecified: Secondary | ICD-10-CM | POA: Insufficient documentation

## 2021-10-08 ENCOUNTER — Telehealth: Payer: Self-pay

## 2021-10-08 NOTE — Telephone Encounter (Signed)
NOTES SCANNED TO REFERRAL 

## 2021-11-05 ENCOUNTER — Ambulatory Visit (INDEPENDENT_AMBULATORY_CARE_PROVIDER_SITE_OTHER): Payer: Medicare Other | Admitting: Cardiovascular Disease

## 2021-11-05 ENCOUNTER — Encounter: Payer: Self-pay | Admitting: Cardiovascular Disease

## 2021-11-05 VITALS — BP 120/80 | HR 83 | Ht 73.5 in | Wt 218.4 lb

## 2021-11-05 DIAGNOSIS — I2584 Coronary atherosclerosis due to calcified coronary lesion: Secondary | ICD-10-CM

## 2021-11-05 DIAGNOSIS — E785 Hyperlipidemia, unspecified: Secondary | ICD-10-CM | POA: Diagnosis not present

## 2021-11-05 DIAGNOSIS — I2510 Atherosclerotic heart disease of native coronary artery without angina pectoris: Secondary | ICD-10-CM

## 2021-11-05 MED ORDER — ROSUVASTATIN CALCIUM 10 MG PO TABS: 10.0000 mg | ORAL_TABLET | Freq: Every day | ORAL | 3 refills | Status: DC

## 2021-11-05 NOTE — Patient Instructions (Addendum)
Medication Instructions:  Your physician has recommended you make the following change in your medication:  Start Crestor '10mg'$  daily. Stop taking Lipitor.   Lab Work: Lipids and ALT in 3 months If you have labs (blood work) drawn today and your tests are completely normal, you will receive your results only by: [place] (if you have MyChart) OR A paper copy in the mail If you have any lab test that is abnormal or we need to change your treatment, we will call you to review the results.   Follow-Up: At [facility], you and your health needs are our priority.  As part of our continuing mission to provide you with exceptional heart care, we have created designated Provider Care Teams.  These Care Teams include your primary Cardiologist (physician) and Advanced Practice Providers (APPs -  Physician Assistants and Nurse Practitioners) who all work together to provide you with the care you need, when you need it.  Your next appointment:   1 year(s)  The format for your next appointment:   In Person  Provider:   Dr. Acie Fredrickson   Important Information About Sugar

## 2021-11-05 NOTE — Progress Notes (Signed)
Cardiology Office Note:    Date:  11/05/2021   ID:  Charles Myers, DOB [birth year], MRN [id number]  PCP:  London Pepper, [facility] Cardiologist:  Charlesa Ehle   }    Referring MD: London Pepper, MD   Chief Complaint  Patient presents with   coronary artery calcification    History of Present Illness:    Charles Myers is a 66 y.o. male with a hx of coronary artery calcification , HLD .  Has bee active  Cycles several times a week .  I saw him back in 2017.  He has routine lung x-rays and scans because of a history of cigarette smoking.  He was incidentally found to have coronary artery calcifications at that time.  He had a coronary calcium scan on Sep 23, 2021.  His score was 468 which places him in the 82nd percentile for age and sex matched controls. No cp, no dyspnea    Last LDL was 79.   Past Medical History:  Diagnosis Date   Anxiety    Epididymitis    HLD (hyperlipidemia)    Lipoma of arm    Medical history non-contributory    Mild hearing loss     Past Surgical History:  Procedure Laterality Date   COLONOSCOPY     CYST EXCISION     removed from back   LIPOMA EXCISION Left 07/27/2014   Procedure: EXCISION LIPOMA LEFT UPPER ARM;  Surgeon: Donnie Mesa, MD;  Location: [facility];  Service: General;  Laterality: Left;   LIPOMA EXCISION N/A 03/03/2017   Procedure: EXCISION OF LIPOMAS OF LEFT SHOULDER AND BACK OF NECK;  Surgeon: Donnie Mesa, MD;  Location: [facility];  Service: General;  Laterality: N/A;   TONSILLECTOMY     VASECTOMY      Current Medications: Current Meds  Medication Sig   rosuvastatin (CRESTOR) 10 MG tablet Take 1 tablet (10 mg total) by mouth daily.     Allergies:   Patient has no known allergies.   Social History   Socioeconomic History   Marital status: Married    Spouse name: Not on file   Number of children: Not on file   Years of education: Not on file   Highest education level: Not on  file  Occupational History   Not on file  Tobacco Use   Smoking status: Former    Packs/day: 1.00    Years: 30.00    Total pack years: 30.00    Types: Cigarettes    Quit date: 05/04/2008    Years since quitting: 13.5   Smokeless tobacco: Never   Tobacco comments:    Reinforced importance of remaining smoke free.  Vaping Use   Vaping Use: Never used  Substance and Sexual Activity   Alcohol use: Yes    Alcohol/week: 12.0 standard drinks of alcohol    Types: 12 Cans of beer per week   Drug use: No   Sexual activity: Not on file  Other Topics Concern   Not on file  Social History Narrative   Not on file   Social Determinants of Health   Financial Resource Strain: Not on file  Food Insecurity: Not on file  Transportation Needs: Not on file  Physical Activity: Not on file  Stress: Not on file  Social Connections: Not on file     Family History: The patient's family history includes Breast cancer in his sister; Colon cancer in his father; Lung cancer  in his brother.  ROS:   Please see the history of present illness.     All other systems reviewed and are negative.  EKGs/Labs/Other Studies Reviewed:    The following studies were reviewed today:   EKG:  EKG 08/28/21 :   ( primary MD   NSR at 71  Recent Labs: No results found for requested labs within last 365 days.  Recent Lipid Panel    Component Value Date/Time   CHOL 147 02/14/2016 0813   TRIG 118 02/14/2016 0813   HDL 39 (L) 02/14/2016 0813   CHOLHDL 3.8 02/14/2016 0813   VLDL 24 02/14/2016 0813   LDLCALC 84 02/14/2016 0813     Risk Assessment/Calculations:           Physical Exam:    VS:  BP 120/80   Pulse 83   Ht 6' 1.5" (1.867 m)   Wt 218 lb 6.4 oz (99.1 kg)   SpO2 96%   BMI 28.42 kg/m     Wt Readings from Last 3 Encounters:  11/05/21 218 lb 6.4 oz (99.1 kg)  03/03/17 198 lb 14.4 oz (90.2 kg)  11/14/15 202 lb (91.6 kg)     GEN:  Well nourished, well developed in no acute  distress HEENT: Normal NECK: No JVD; No carotid bruits LYMPHATICS: No lymphadenopathy CARDIAC: RRR, no murmurs, rubs, gallops RESPIRATORY:  Clear to auscultation without rales, wheezing or rhonchi  ABDOMEN: Soft, non-tender, non-distended MUSCULOSKELETAL:  No edema; No deformity  SKIN: Warm and dry NEUROLOGIC:  Alert and oriented x 3 PSYCHIATRIC:  Normal affect   ASSESSMENT:    1. Hyperlipidemia, unspecified hyperlipidemia type   2. Coronary artery calcification    PLAN:     Coronary artery calcifications: He has coronary artery calcium score of 468.  I would like for his LDL to be between 50 and 70.  He only tolerates atorvastatin up to 20 mg a day.  We will try rosuvastatin 10 mg a day to see if he can get his lipids lower.  I would have a low threshold to add ezetimibe if that does not get him to goal.   I have advised him to continue to work out on a regular basis      Medication Adjustments/Labs and Tests Ordered: Current medicines are reviewed at length with the patient today.  Concerns regarding medicines are outlined above.  Orders Placed This Encounter  Procedures   ALT   Lipid panel   Meds ordered this encounter  Medications   rosuvastatin (CRESTOR) 10 MG tablet    Sig: Take 1 tablet (10 mg total) by mouth daily.    Dispense:  90 tablet    Refill:  3    Patient Instructions  Medication Instructions:  Your physician has recommended you make the following change in your medication:  Start Crestor '10mg'$  daily. Stop taking Lipitor.   Lab Work: Lipids and ALT in 3 months If you have labs (blood work) drawn today and your tests are completely normal, you will receive your results only by: [place] (if you have MyChart) OR A paper copy in the mail If you have any lab test that is abnormal or we need to change your treatment, we will call you to review the results.   Follow-Up: At [facility], you and your health needs are our priority.  As part of  our continuing mission to provide you with exceptional heart care, we have created designated Provider Care Teams.  These Care Teams  include your primary Cardiologist (physician) and Advanced Practice Providers (APPs -  Physician Assistants and Nurse Practitioners) who all work together to provide you with the care you need, when you need it.  Your next appointment:   1 year(s)  The format for your next appointment:   In Person  Provider:   Dr. Acie Fredrickson   Important Information About Sugar         Signed, Mertie Moores, MD  11/05/2021 3:49 PM    [place]

## 2021-12-18 ENCOUNTER — Encounter: Payer: Self-pay | Admitting: Cardiovascular Disease

## 2021-12-18 DIAGNOSIS — Z79899 Other long term (current) drug therapy: Secondary | ICD-10-CM

## 2021-12-18 NOTE — Telephone Encounter (Signed)
Attempted to call patient, but no answer. Per OV note on 11/05/21:  Coronary artery calcifications: He has coronary artery calcium score of 468.  I would like for his LDL to be between 50 and 70.  He only tolerates atorvastatin up to 20 mg a day.  We will try rosuvastatin 10 mg a day to see if he can get his lipids lower.  I would have a low threshold to add ezetimibe if that does not get him to goal.   Will route to PharmD for review on appropriateness of CoQ10, then can forward to MD if necessary.

## 2021-12-22 MED ORDER — EZETIMIBE 10 MG PO TABS: 10.0000 mg | ORAL_TABLET | Freq: Every day | ORAL | 3 refills | Status: DC

## 2022-02-09 ENCOUNTER — Other Ambulatory Visit: Payer: Medicare Other

## 2022-02-24 ENCOUNTER — Ambulatory Visit: Payer: Medicare Other | Attending: Cardiovascular Disease

## 2022-02-24 DIAGNOSIS — Z79899 Other long term (current) drug therapy: Secondary | ICD-10-CM

## 2022-02-25 LAB — HEPATIC FUNCTION PANEL
ALT: 16 IU/L (ref 0–44)
AST: 15 IU/L (ref 0–40)
Albumin: 4.2 g/dL (ref 3.9–4.9)
Alkaline Phosphatase: 126 IU/L — ABNORMAL HIGH (ref 44–121)
Bilirubin Total: 0.3 mg/dL (ref 0.0–1.2)
Bilirubin, Direct: <0.1 mg/dL (ref 0.00–0.40)
Total Protein: 6.8 g/dL (ref 6.0–8.5)

## 2022-02-25 LAB — LIPID PANEL
Chol/HDL Ratio: 3.5 ratio (ref 0.0–5.0)
Cholesterol, Total: 138 mg/dL (ref 100–199)
HDL: 39 mg/dL — ABNORMAL LOW (ref >39)
LDL Chol Calc (NIH): 80 mg/dL (ref 0–99)
Triglycerides: 100 mg/dL (ref 0–149)
VLDL Cholesterol Cal: 19 mg/dL (ref 5–40)

## 2022-02-25 LAB — APOLIPOPROTEIN B: Apolipoprotein B: 75 mg/dL (ref <90)

## 2022-02-27 ENCOUNTER — Telehealth: Payer: Self-pay

## 2022-02-27 MED ORDER — EZETIMIBE 10 MG PO TABS: 10.0000 mg | ORAL_TABLET | Freq: Every day | ORAL | 3 refills | Status: DC

## 2022-02-27 NOTE — Telephone Encounter (Signed)
-----   Message from Thayer Headings, MD sent at 02/25/2022  5:07 PM EDT ----- In my review of the phone note from August 17, I cannot tell if he is on atorvastatin, rosuvastatin, Zetia or just diet alone.  It sounds like he does not tolerate rosuvastatin but tolerates was atorvastatin better.  If he is not on atorvastatin ( or rosuvastatin) and Zetia, please have him start   If he is taking a statin plus Zetia then that combination is not strong enough for him.  His LDL is 80 and his goal is 55.  In that case, please refer him to the lipid clinic for consideration of a PCSK9 inhibitor.

## 2022-02-27 NOTE — Telephone Encounter (Signed)
See MyChart encounter for documentation.  Pt is currently taking Atorvastatin '20mg'$  daily. He chose not to pick up zetia from pharmacy because it was too expensive (it was for 3 months' supply). He wanted to work on diet and do this re-check. He states that since it didn't work, he will now pick up and begin the zetia. He is going to get a 1 month supply to ensure he can take it without issues, then advised him to let us know. If tolerable, he's going to continue taking it daily and we will repeat labs in January.

## 2022-03-02 ENCOUNTER — Other Ambulatory Visit (HOSPITAL_COMMUNITY): Payer: Self-pay

## 2022-03-02 MED ORDER — AMOXICILLIN 500 MG PO CAPS
500.0000 mg | ORAL_CAPSULE | Freq: Four times a day (QID) | ORAL | 0 refills | Status: DC
  Filled 2022-03-02: qty 28, 7d supply, fill #0

## 2022-04-13 ENCOUNTER — Encounter: Payer: Self-pay | Admitting: Cardiovascular Disease

## 2022-04-13 DIAGNOSIS — E785 Hyperlipidemia, unspecified: Secondary | ICD-10-CM

## 2022-04-13 DIAGNOSIS — Z79899 Other long term (current) drug therapy: Secondary | ICD-10-CM

## 2022-05-14 ENCOUNTER — Ambulatory Visit: Payer: Medicare Other | Attending: Cardiovascular Disease

## 2022-05-14 ENCOUNTER — Telehealth: Payer: Self-pay | Admitting: Acute Care

## 2022-05-14 DIAGNOSIS — I2510 Atherosclerotic heart disease of native coronary artery without angina pectoris: Secondary | ICD-10-CM

## 2022-05-14 DIAGNOSIS — Z79899 Other long term (current) drug therapy: Secondary | ICD-10-CM

## 2022-05-14 DIAGNOSIS — Z122 Encounter for screening for malignant neoplasm of respiratory organs: Secondary | ICD-10-CM

## 2022-05-14 DIAGNOSIS — Z87891 Personal history of nicotine dependence: Secondary | ICD-10-CM

## 2022-05-14 DIAGNOSIS — E785 Hyperlipidemia, unspecified: Secondary | ICD-10-CM

## 2022-05-14 LAB — BASIC METABOLIC PANEL
BUN/Creatinine Ratio: 11 (ref 10–24)
BUN: 12 mg/dL (ref 8–27)
Calcium: 9.2 mg/dL (ref 8.6–10.2)
Chloride: 105 mmol/L (ref 96–106)
Creatinine, Ser: 1.06 mg/dL (ref 0.76–1.27)
Glucose: 102 mg/dL — ABNORMAL HIGH (ref 70–99)
Potassium: 4.7 mmol/L (ref 3.5–5.2)
Sodium: 141 mmol/L (ref 134–144)

## 2022-05-14 LAB — LIPOPROTEIN A (LPA)

## 2022-05-14 LAB — LIPID PANEL: LDL Chol Calc (NIH): 49 mg/dL (ref 0–99)

## 2022-05-14 NOTE — Telephone Encounter (Signed)
Pls call to resched Lung Screen CT that he cancelled. Wants it in April.

## 2022-05-15 LAB — ALT: ALT: 21 IU/L (ref 0–44)

## 2022-05-15 LAB — LIPID PANEL
Chol/HDL Ratio: 2.8 ratio (ref 0.0–5.0)
Cholesterol, Total: 98 mg/dL — ABNORMAL LOW (ref 100–199)
HDL: 35 mg/dL — ABNORMAL LOW (ref >39)
Triglycerides: 65 mg/dL (ref 0–149)
VLDL Cholesterol Cal: 14 mg/dL (ref 5–40)

## 2022-05-15 LAB — BASIC METABOLIC PANEL
CO2: 24 mmol/L (ref 20–29)
eGFR: 77 mL/min/{1.73_m2} (ref >59)

## 2022-05-15 NOTE — Telephone Encounter (Signed)
Spoke with pt and reschedule lung screening CT 08/11/22 11:40.

## 2022-06-08 DIAGNOSIS — K5720 Diverticulitis of large intestine with perforation and abscess without bleeding: Secondary | ICD-10-CM

## 2022-06-08 HISTORY — DX: Diverticulitis of large intestine with perforation and abscess without bleeding: K57.20

## 2022-06-19 ENCOUNTER — Other Ambulatory Visit: Payer: Medicare Other

## 2022-06-21 HISTORY — PX: COLECTOMY WITH COLOSTOMY CREATION/HARTMANN PROCEDURE: SHX6598

## 2022-07-17 ENCOUNTER — Ambulatory Visit (HOSPITAL_COMMUNITY)
Admission: RE | Admit: 2022-07-17 | Discharge: 2022-07-17 | Disposition: A | Discharge status: Home / Self Care | Payer: Medicare Other | Source: Ambulatory Visit | Attending: Nurse Practitioner | Admitting: Nurse Practitioner

## 2022-07-17 DIAGNOSIS — K9400 Colostomy complication, unspecified: Secondary | ICD-10-CM | POA: Diagnosis not present

## 2022-07-17 DIAGNOSIS — T148XXA Other injury of unspecified body region, initial encounter: Secondary | ICD-10-CM | POA: Diagnosis not present

## 2022-07-17 DIAGNOSIS — Z933 Colostomy status: Secondary | ICD-10-CM | POA: Diagnosis not present

## 2022-07-17 DIAGNOSIS — Z433 Encounter for attention to colostomy: Secondary | ICD-10-CM | POA: Diagnosis present

## 2022-07-17 DIAGNOSIS — K5792 Diverticulitis of intestine, part unspecified, without perforation or abscess without bleeding: Secondary | ICD-10-CM | POA: Insufficient documentation

## 2022-07-17 DIAGNOSIS — L24B3 Irritant contact dermatitis related to fecal or urinary stoma or fistula: Secondary | ICD-10-CM | POA: Diagnosis not present

## 2022-07-17 NOTE — Progress Notes (Signed)
[facility]   Reason for visit:  LLQ colostomy Midline surgical wound HPI:  Diverticulitis with colostomy  became ill and had emergent surgery in [place].  Approaching window to consider reversal and would like to be set up with local surgeon (prefers Dr Marcello Moores) Past Medical History:  Diagnosis Date   Anxiety    Epididymitis    HLD (hyperlipidemia)    Lipoma of arm    Medical history non-contributory    Mild hearing loss    Family History  Problem Relation Age of Onset   Colon cancer Father    Lung cancer Brother    Breast cancer Sister    No Known Allergies Current Outpatient Medications  Medication Sig Dispense Refill Last Dose   amoxicillin (AMOXIL) 500 MG capsule Take 1 capsule (500 mg total) by mouth 4 (four) times daily until finished 28 capsule 0    aspirin EC 81 MG tablet Take 81 mg by mouth daily. Swallow whole. (Patient not taking: Reported on 11/05/2021)      ezetimibe (ZETIA) 10 MG tablet Take 1 tablet (10 mg total) by mouth daily. 90 tablet 3    Multiple Vitamins-Minerals (MULTIVITAMIN WITH MINERALS) tablet Take 1 tablet by mouth daily. (Patient not taking: Reported on 11/05/2021)      rosuvastatin (CRESTOR) 10 MG tablet Take 1 tablet (10 mg total) by mouth daily. 90 tablet 3    No current facility-administered medications for this encounter.   ROS  Review of Systems  Gastrointestinal:        LLQ colostomy   Skin:  Positive for rash and wound.       Contact dermatitis Surgical wound  Psychiatric/Behavioral:         Wife performing ostomy and wound care  All other systems reviewed and are negative.  Vital signs:  BP 110/73 (BP Location: Right Arm)   Pulse 100   Temp 98.6 F (37 C) (Oral)   Resp 18   SpO2 98%  Exam:  Physical Exam Vitals reviewed.  Constitutional:      Appearance: Normal appearance.  Abdominal:     Palpations: Abdomen is soft.     Comments: Midline abdominal wound, packing with packing strip  Neurological:     Mental  Status: He is alert.     Stoma type/location:  LLQ colostomy, flush from 6 to 12 oclock, os points toward 9 o'clock (and midline incision) Stomal assessment/size:  1 1/4" oval, flush on one side Peristomal assessment:  irritant dermatitis from 6 to 12 o'clock due to effluent on skin.  Treatment options for stomal/peristomal skin: stoma powder and skin prep. Barrier ring  Output: soft brown stool Ostomy pouching: 2pc. 2 3/4" pouch with convex barrier.  Barrier ring from 6 to 12 oclock (flush stoma) Education provided:  Instructed on use of ostomy belt, provided belt but does not wish to wear today.  Will apply later.  Wound care performed and supplies given.      Impression/dx  Colostomy Irritant dermatitis Discussion  Wound care Ostomy care Some struggle getting established with [facility] due to [facility] not discharging. ( In [place])  Plan  See back in 2 weeks Supplies given for wound care    Visit time: 55 minutes.   Domenic Moras FNP-BC

## 2022-07-17 NOTE — Discharge Instructions (Signed)
Continue wound care [facility] for supplies Follow up with [facility]

## 2022-08-07 ENCOUNTER — Ambulatory Visit (HOSPITAL_COMMUNITY)
Admission: RE | Admit: 2022-08-07 | Discharge: 2022-08-07 | Disposition: A | Discharge status: Home / Self Care | Payer: Medicare Other | Source: Ambulatory Visit | Attending: Family Medicine | Admitting: Family Medicine

## 2022-08-07 DIAGNOSIS — T148XXA Other injury of unspecified body region, initial encounter: Secondary | ICD-10-CM

## 2022-08-07 DIAGNOSIS — K9400 Colostomy complication, unspecified: Secondary | ICD-10-CM

## 2022-08-07 DIAGNOSIS — Z433 Encounter for attention to colostomy: Secondary | ICD-10-CM | POA: Diagnosis present

## 2022-08-07 NOTE — Progress Notes (Signed)
[facility]   Reason for visit:  LLQ colostomy Midline surgical wound, improving.  Now only packing distal end HPI:  Emergent colostomy while on vacation- diverticulitis Past Medical History:  Diagnosis Date   Anxiety    Epididymitis    HLD (hyperlipidemia)    Lipoma of arm    Medical history non-contributory    Mild hearing loss    Family History  Problem Relation Age of Onset   Colon cancer Father    Lung cancer Brother    Breast cancer Sister    No Known Allergies Current Outpatient Medications  Medication Sig Dispense Refill Last Dose   amoxicillin (AMOXIL) 500 MG capsule Take 1 capsule (500 mg total) by mouth 4 (four) times daily until finished 28 capsule 0    aspirin EC 81 MG tablet Take 81 mg by mouth daily. Swallow whole. (Patient not taking: Reported on 11/05/2021)      ezetimibe (ZETIA) 10 MG tablet Take 1 tablet (10 mg total) by mouth daily. 90 tablet 3    Multiple Vitamins-Minerals (MULTIVITAMIN WITH MINERALS) tablet Take 1 tablet by mouth daily. (Patient not taking: Reported on 11/05/2021)      rosuvastatin (CRESTOR) 10 MG tablet Take 1 tablet (10 mg total) by mouth daily. 90 tablet 3    No current facility-administered medications for this encounter.   ROS  Review of Systems  Gastrointestinal:        LLQ colostomy  Skin:  Positive for wound.       Midline wound, improving  Psychiatric/Behavioral: Negative.    All other systems reviewed and are negative.  Vital signs:  BP 121/73 (BP Location: Right Arm)   Pulse 95   Temp 97.6 F (36.4 C) (Oral)   Resp 18   SpO2 98%  Exam:  Physical Exam Vitals reviewed.  Constitutional:      Appearance: Normal appearance.  Abdominal:     Palpations: Abdomen is soft.  Skin:    General: Skin is warm and dry.  Neurological:     Mental Status: He is alert and oriented to person, place, and time.  Psychiatric:        Mood and Affect: Mood normal.        Behavior: Behavior normal.     Comments: Wife  performing wound and ostomy care.  Doing excellent job     Stoma type/location:  LLQ colostomy Stomal assessment/size:  1 1/4" less oval today and less flush on one side as it was before Peristomal assessment:  improved Treatment options for stomal/peristomal skin: 2 piece convex pouch with barrier ring (along peristomal skin circumferentially) Output: soft browns stool Ostomy pouching: 2pc. Convex with barrier ring Education provided:  continue same pouch, skin improving Wound improving.  Wound care provided today.     Impression/dx  colostomy Discussion  See back as needed.  Plan  Supplies given (tape) as needed.     Visit time: 45 minutes.   Maple Hudson FNP-BC

## 2022-08-10 NOTE — Discharge Instructions (Signed)
Continue packing distal end of midline wound No changes in pouching

## 2022-08-11 ENCOUNTER — Ambulatory Visit
Admission: RE | Admit: 2022-08-11 | Discharge: 2022-08-11 | Disposition: A | Discharge status: Home / Self Care | Payer: Medicare Other | Source: Ambulatory Visit | Attending: Acute Care | Admitting: Acute Care

## 2022-08-11 DIAGNOSIS — Z122 Encounter for screening for malignant neoplasm of respiratory organs: Secondary | ICD-10-CM

## 2022-08-11 DIAGNOSIS — Z87891 Personal history of nicotine dependence: Secondary | ICD-10-CM

## 2022-08-14 ENCOUNTER — Other Ambulatory Visit: Payer: Self-pay

## 2022-08-14 DIAGNOSIS — Z87891 Personal history of nicotine dependence: Secondary | ICD-10-CM

## 2022-08-14 DIAGNOSIS — Z122 Encounter for screening for malignant neoplasm of respiratory organs: Secondary | ICD-10-CM

## 2022-08-17 ENCOUNTER — Ambulatory Visit: Payer: Self-pay | Admitting: General Surgery

## 2022-09-21 ENCOUNTER — Ambulatory Visit: Payer: Self-pay | Admitting: General Surgery

## 2022-09-21 NOTE — H&P (View-Only) (Signed)
 REFERRING PHYSICIAN:  Morrow, Aaron S, MD  PROVIDER:  Nathian Stencil CHRISTINE Hester Joslin, MD  MRN: [id number] DOB: 07/14/1955 DATE OF ENCOUNTER: 09/21/2022  Subjective   Chief Complaint: No chief complaint on file.     History of Present Illness: Charles Myers is a 67 y.o. male who is seen today as an office consultation at the request of Dr. Morrow for evaluation of No chief complaint on file. .  67-year-old male who became ill in [place] and required emergent surgery with colostomy.  This happened in mid February 2024.  They state that he developed severe abdominal pain and was taken to the hospital and found to have diverticulitis with abscess.  He was placed on IV antibiotics and then worsened approximately 2 days later requiring emergent surgery.  He was noted to have a large perforation in his mid sigmoid colon.  This was resected and a Hartman's procedure was performed.  He was then in the hospital for another 7 days after that with an ileus and required TPN and PICC line.  He developed a DVT in his PICC line vein that is requiring Eliquis for 3 months.  He is otherwise recovering well.  His wound is healing by secondary intention and almost closed.  His wife is packing this daily.  He is gaining weight.  Last colonoscopy was less than a year ago.  Several polyps and diverticulosis were noted.  Past Medical History:  Diagnosis Date   Anxiety    Epididymitis    HLD (hyperlipidemia)    Lipoma of arm    Medical history non-contributory    Mild hearing loss    Past Surgical History:  Procedure Laterality Date   COLONOSCOPY     CYST EXCISION     removed from back   LIPOMA EXCISION Left 07/27/2014   Procedure: EXCISION LIPOMA LEFT UPPER ARM;  Surgeon: Matthew Tsuei, MD;  Location: [facility];  Service: General;  Laterality: Left;   LIPOMA EXCISION N/A 03/03/2017   Procedure: EXCISION OF LIPOMAS OF LEFT SHOULDER AND BACK OF NECK;  Surgeon: Tsuei, Matthew, MD;  Location: [facility];   Service: General;  Laterality: N/A;   TONSILLECTOMY     VASECTOMY     Family History  Problem Relation Age of Onset   Colon cancer Father    Lung cancer Brother    Breast cancer Sister    Social History   Socioeconomic History   Marital status: Married    Spouse name: Not on file   Number of children: Not on file   Years of education: Not on file   Highest education level: Not on file  Occupational History   Not on file  Tobacco Use   Smoking status: Former    Packs/day: 1.00    Years: 30.00    Additional pack years: 0.00    Total pack years: 30.00    Types: Cigarettes    Quit date: 05/04/2008    Years since quitting: 14.3   Smokeless tobacco: Never   Tobacco comments:    Reinforced importance of remaining smoke free.  Vaping Use   Vaping Use: Never used  Substance and Sexual Activity   Alcohol use: Yes    Alcohol/week: 12.0 standard drinks of alcohol    Types: 12 Cans of beer per week   Drug use: No   Sexual activity: Not on file  Other Topics Concern   Not on file  Social History Narrative   Not on file   Social   Determinants of Health   Financial Resource Strain: Not on file  Food Insecurity: Not on file  Transportation Needs: Not on file  Physical Activity: Not on file  Stress: Not on file  Social Connections: Not on file  Intimate Partner Violence: Not on file    Current Outpatient Medications:    amoxicillin (AMOXIL) 500 MG capsule, Take 1 capsule (500 mg total) by mouth 4 (four) times daily until finished, Disp: 28 capsule, Rfl: 0   aspirin EC 81 MG tablet, Take 81 mg by mouth daily. Swallow whole. (Patient not taking: Reported on 11/05/2021), Disp: , Rfl:    ezetimibe (ZETIA) 10 MG tablet, Take 1 tablet (10 mg total) by mouth daily., Disp: 90 tablet, Rfl: 3   Multiple Vitamins-Minerals (MULTIVITAMIN WITH MINERALS) tablet, Take 1 tablet by mouth daily. (Patient not taking: Reported on 11/05/2021), Disp: , Rfl:    rosuvastatin (CRESTOR) 10 MG tablet, Take 1  tablet (10 mg total) by mouth daily., Disp: 90 tablet, Rfl: 3 No Known Allergies Review of Systems - Negative except as stated above    Objective:    Vitals:   09/21/22 1450  Weight: 95.3 kg (210 lb)  PainSc: 0-No pain      Exam Gen: NAD Abd: soft, non-distended Ostomy beefy red Wound opened at top and bottom spots along his midline incision.  Gauze packing present (2 cm inferior lesion).  No signs of wound infection.  Good granulation tissue.   Labs, Imaging and Diagnostic Testing: Operative report, pathology reports and CT images reviewed.  CT images show significant inflammation in the mid abdomen with inflammatory changes around the small bowel.  There appears to be minimal inflammation within the pelvis.  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  Assessment and Plan:  Diagnoses and all orders for this visit:  Status post Hartmann procedure (CMS/HHS-HCC)    67-year-old male status post emergent Hartman's procedure for perforated diverticulitis.  Discussed reversal procedures.  I do not think he needs a repeat colonoscopy if he has had 1 in the last year.  We will wait until he is off his Eliquis which should be the end of May.  I would also like for his wound to heal completely.  I will see him back in 1 month to evaluate for this.  We will plan on an open colostomy reversal in early June.  We discussed that we could perform a minimally invasive colostomy reversal in the late summer, which would likely result in a much lower time in the hospital afterwards.  Patient prefers to go ahead with open surgery and is aware that there is a significant risk for postoperative ileus and need for prolonged hospital stay of 5 to 7 days or longer.  He is at significant risk of hernia as well.  The surgery and anatomy were described to the patient as well as the risks of surgery and the possible complications.  These include:  Bleeding, deep abdominal infections and possible wound complications such as hernia and infection, damage to adjacent structures, leak of surgical connections, which can lead to other surgeries and possibly another ostomy, possible need for other procedures, such as abscess drains in radiology, possible prolonged hospital stay, possible diarrhea from removal of part of the colon, possible constipation from narcotics, possible bowel, bladder or sexual dysfunction if having rectal surgery, prolonged fatigue/weakness or appetite loss, possible early recurrence of of disease, possible complications of their medical problems such   as heart disease or arrhythmias or lung problems, death (less than 1%). I believe the patient understands and wishes to proceed with the surgery.     Corry Ihnen C Tranae Laramie, MD Colon and Rectal Surgery [facility]  

## 2022-09-21 NOTE — H&P (Signed)
REFERRING PHYSICIAN:  Blenda Bridegroom, MD  PROVIDER:  Elenora Gamma, MD  MRN: [id number] DOB: Jun 15, 1955 DATE OF ENCOUNTER: 09/21/2022  Subjective   Chief Complaint: No chief complaint on file.     History of Present Illness: Charles Myers is a 67 y.o. male who is seen today as an office consultation at the request of Dr. Kateri Myers for evaluation of No chief complaint on file. .  67 year old male who became ill in [place] and required emergent surgery with colostomy.  This happened in mid February 2024.  They state that he developed severe abdominal pain and was taken to the hospital and found to have diverticulitis with abscess.  He was placed on IV antibiotics and then worsened approximately 2 days later requiring emergent surgery.  He was noted to have a large perforation in his mid sigmoid colon.  This was resected and a Hartman's procedure was performed.  He was then in the hospital for another 7 days after that with an ileus and required TPN and PICC line.  He developed a DVT in his PICC line vein that is requiring Eliquis for 3 months.  He is otherwise recovering well.  His wound is healing by secondary intention and almost closed.  His wife is packing this daily.  He is gaining weight.  Last colonoscopy was less than a year ago.  Several polyps and diverticulosis were noted.  Past Medical History:  Diagnosis Date   Anxiety    Epididymitis    HLD (hyperlipidemia)    Lipoma of arm    Medical history non-contributory    Mild hearing loss    Past Surgical History:  Procedure Laterality Date   COLONOSCOPY     CYST EXCISION     removed from back   LIPOMA EXCISION Left 07/27/2014   Procedure: EXCISION LIPOMA LEFT UPPER ARM;  Surgeon: Manus Rudd, MD;  Location: [facility];  Service: General;  Laterality: Left;   LIPOMA EXCISION N/A 03/03/2017   Procedure: EXCISION OF LIPOMAS OF LEFT SHOULDER AND BACK OF NECK;  Surgeon: Manus Rudd, MD;  Location: [facility];   Service: General;  Laterality: N/A;   TONSILLECTOMY     VASECTOMY     Family History  Problem Relation Age of Onset   Colon cancer Father    Lung cancer Brother    Breast cancer Sister    Social History   Socioeconomic History   Marital status: Married    Spouse name: Not on file   Number of children: Not on file   Years of education: Not on file   Highest education level: Not on file  Occupational History   Not on file  Tobacco Use   Smoking status: Former    Packs/day: 1.00    Years: 30.00    Additional pack years: 0.00    Total pack years: 30.00    Types: Cigarettes    Quit date: 05/04/2008    Years since quitting: 14.3   Smokeless tobacco: Never   Tobacco comments:    Reinforced importance of remaining smoke free.  Vaping Use   Vaping Use: Never used  Substance and Sexual Activity   Alcohol use: Yes    Alcohol/week: 12.0 standard drinks of alcohol    Types: 12 Cans of beer per week   Drug use: No   Sexual activity: Not on file  Other Topics Concern   Not on file  Social History Narrative   Not on file   Social  Determinants of Health   Financial Resource Strain: Not on file  Food Insecurity: Not on file  Transportation Needs: Not on file  Physical Activity: Not on file  Stress: Not on file  Social Connections: Not on file  Intimate Partner Violence: Not on file    Current Outpatient Medications:    amoxicillin (AMOXIL) 500 MG capsule, Take 1 capsule (500 mg total) by mouth 4 (four) times daily until finished, Disp: 28 capsule, Rfl: 0   aspirin EC 81 MG tablet, Take 81 mg by mouth daily. Swallow whole. (Patient not taking: Reported on 11/05/2021), Disp: , Rfl:    ezetimibe (ZETIA) 10 MG tablet, Take 1 tablet (10 mg total) by mouth daily., Disp: 90 tablet, Rfl: 3   Multiple Vitamins-Minerals (MULTIVITAMIN WITH MINERALS) tablet, Take 1 tablet by mouth daily. (Patient not taking: Reported on 11/05/2021), Disp: , Rfl:    rosuvastatin (CRESTOR) 10 MG tablet, Take 1  tablet (10 mg total) by mouth daily., Disp: 90 tablet, Rfl: 3 No Known Allergies Review of Systems - Negative except as stated above    Objective:    Vitals:   09/21/22 1450  Weight: 95.3 kg (210 lb)  PainSc: 0-No pain      Exam Gen: NAD Abd: soft, non-distended Ostomy beefy red Wound opened at top and bottom spots along his midline incision.  Gauze packing present (2 cm inferior lesion).  No signs of wound infection.  Good granulation tissue.   Labs, Imaging and Diagnostic Testing: Operative report, pathology reports and CT images reviewed.  CT images show significant inflammation in the mid abdomen with inflammatory changes around the small bowel.  There appears to be minimal inflammation within the pelvis.  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  Assessment and Plan:  Diagnoses and all orders for this visit:  Status post Hartmann procedure (CMS/HHS-HCC)    67 year old male status post emergent Hartman's procedure for perforated diverticulitis.  Discussed reversal procedures.  I do not think he needs a repeat colonoscopy if he has had 1 in the last year.  We will wait until he is off his Eliquis which should be the end of May.  I would also like for his wound to heal completely.  I will see him back in 1 month to evaluate for this.  We will plan on an open colostomy reversal in early June.  We discussed that we could perform a minimally invasive colostomy reversal in the late summer, which would likely result in a much lower time in the hospital afterwards.  Patient prefers to go ahead with open surgery and is aware that there is a significant risk for postoperative ileus and need for prolonged hospital stay of 5 to 7 days or longer.  He is at significant risk of hernia as well.  The surgery and anatomy were described to the patient as well as the risks of surgery and the possible complications.  These include:  Bleeding, deep abdominal infections and possible wound complications such as hernia and infection, damage to adjacent structures, leak of surgical connections, which can lead to other surgeries and possibly another ostomy, possible need for other procedures, such as abscess drains in radiology, possible prolonged hospital stay, possible diarrhea from removal of part of the colon, possible constipation from narcotics, possible bowel, bladder or sexual dysfunction if having rectal surgery, prolonged fatigue/weakness or appetite loss, possible early recurrence of of disease, possible complications of their medical problems such  as heart disease or arrhythmias or lung problems, death (less than 1%). I believe the patient understands and wishes to proceed with the surgery.     Vanita Panda, MD Colon and Rectal Surgery [facility]

## 2022-10-01 NOTE — Progress Notes (Addendum)
COVID Vaccine received:  []  No [x]  Yes Date of any COVID positive Test in last 90 days:  None  PCP - Farris Has, MD at [facility]  [phone] Cardiologist - Kristeen Miss, MD  Chest x-ray -  EKG -  08-28-2021  Epic,  Had EKG done at Dr. Vincente Liberty on 09-17-22-- on chart  Stress Test -  ECHO -  Cardiac Cath -  CTA Cardiac Calcium score: 468  on 09-22-21   Epic  PCR screen: []  Ordered & Completed           [x]   No Order but Needs PROFEND           []   N/A for this surgery  Surgery Plan:  []  Ambulatory                            []  Outpatient in bed                            [x]  Admit  Anesthesia:    [x]  General  []  Spinal                           []   Choice []   MAC  Bowel Prep - []  No  [x]   Yes _dulcolax, miralax. Clear liquid diet the day before surgery_  Pacemaker / ICD device [x]  No []  Yes   Spinal Cord Stimulator:[x]  No []  Yes       History of Sleep Apnea? [x]  No []  Yes   CPAP used?- [x]  No []  Yes    Does the patient monitor blood sugar?          []  No []  Yes  [x]  N/A  Patient has: [x]  NO Hx DM   []  Pre-DM                 []  DM1  []   DM2  Blood Thinner / Instructions:  Eliquis  stopped for good on Monday 09-28-2022 per patient Aspirin Instructions:none  ERAS Protocol Ordered: []  No  [x]  Yes PRE-SURGERY [x]  ENSURE  x3   []  G2  Patient is to be NPO after: 0630 am  Activity level: Patient is able to climb a flight of stairs without difficulty; [x]  No CP  [x]  No SOB   Patient can / can not perform ADLs without assistance.   Anesthesia review: anxiety, coronary calcium score 468  Patient denies shortness of breath, fever, cough and chest pain at PAT appointment.  Patient verbalized understanding and agreement to the Pre-Surgical Instructions that were given to them at this PAT appointment. Patient was also educated of the need to review these PAT instructions again prior to his surgery.I reviewed the appropriate phone numbers to call if they have any and  questions or concerns.

## 2022-10-01 NOTE — Patient Instructions (Addendum)
SURGICAL WAITING ROOM VISITATION Patients having surgery or a procedure may have no more than 2 support people in the waiting area - these visitors may rotate in the visitor waiting room.   Due to an increase in RSV and influenza rates and associated hospitalizations, children ages 43 and under may not visit patients in [facility] hospitals. If the patient needs to stay at the hospital during part of their recovery, the visitor guidelines for inpatient rooms apply.  PRE-OP VISITATION  Pre-op nurse will coordinate an appropriate time for 1 support person to accompany the patient in pre-op.  This support person may not rotate.  This visitor will be contacted when the time is appropriate for the visitor to come back in the pre-op area.  Please refer to the [facility] website for the visitor guidelines for Inpatients (after your surgery is over and you are in a regular room).  You are not required to quarantine at this time prior to your surgery. However, you must do this: Hand Hygiene often Do NOT share personal items Notify your provider if you are in close contact with someone who has COVID or you develop fever 100.4 or greater, new onset of sneezing, cough, sore throat, shortness of breath or body aches.  If you test positive for Covid or have been in contact with anyone that has tested positive in the last 10 days please notify you surgeon.    Your procedure is scheduled on:  Thursday  October 08, 2022  Report to [facility] Main Entrance: [place] entrance where the [employer] is available.   Report to admitting at: 07:15    AM  +++++Call this number if you have any questions or problems the morning of surgery [phone]  Eat a clear liquid diet the day before your surgery  DRINK two (2) bottles of Pre-Surgery Clear Ensure or G2 drink starting at 6:00 pm the evening prior to your surgery to help prevent dehydration. Increase drinking clear fluids (see list below)           After Midnight you may have the following liquids until  06:30 AM DAY OF SURGERY  Clear Liquid Diet Water Black Coffee (sugar ok, NO MILK/CREAM OR CREAMERS)  Tea (sugar ok, NO MILK/CREAM OR CREAMERS) regular and decaf                             Plain Jell-O  with no fruit (NO RED)                                           Fruit ices (not with fruit pulp, NO RED)                                     Popsicles (NO RED)                                                                  Juice: apple, WHITE grape, WHITE cranberry Sports drinks like Gatorade or Powerade (NO RED)  The day of surgery:  Drink ONE (1) Pre-Surgery Clear Ensure at   06:30     AM the morning of surgery. Drink in one sitting. Do not sip.  This drink was given to you during your hospital pre-op appointment visit. Nothing else to drink after completing the Pre-Surgery Clear Ensure  : No candy, chewing gum or throat lozenges.    FOLLOW BOWEL PREP AND ANY ADDITIONAL PRE OP INSTRUCTIONS YOU RECEIVED FROM YOUR SURGEON'S OFFICE!!!   Dulcolax 20 mg (total) - Take 4 (four) of the 5 mg Dulcolax tablets with water at 07:00 am the day prior to surgery.  Miralax 255 g - Mix with 64 oz Gatorade/Powerade.  Starting at 10:00 am ,Drink this gradually over the next few hours (8 oz glass every 15-30 minutes) until gone the day prior to surgery You should finish in 4 hours-6 hours.    Take your antibiotics as instructed.   Drink plenty of clear liquids all evening to avoid getting dehydrated.     Oral Hygiene is also important to reduce your risk of infection.        Remember - BRUSH YOUR TEETH THE MORNING OF SURGERY WITH YOUR REGULAR TOOTHPASTE  Do NOT smoke after Midnight the night before surgery.  Take ONLY these medicines the morning of surgery with A SIP OF WATER:  Omeprazole                    You may not have any metal on your body including  jewelry, and body piercing  Do not wear  lotions,  powders, cologne, or deodorant   Men may shave face and neck.   You may bring a small overnight bag with you on the day of surgery, only pack items that are not valuable. [facility] IS NOT RESPONSIBLE   FOR VALUABLES THAT ARE LOST OR STOLEN.   Do not bring your home medications to the hospital. The Pharmacy will dispense medications listed on your medication list to you during your admission in the Hospital.  Special Instructions: Bring a copy of your healthcare power of attorney and living will documents the day of surgery, if you wish to have them scanned into your [facility] Records- EPIC  Please read over the following fact sheets you were given: IF YOU HAVE QUESTIONS ABOUT YOUR PRE-OP INSTRUCTIONS, PLEASE CALL [phone].   [facility] - Preparing for Surgery Before surgery, you can play an important role.  Because skin is not sterile, your skin needs to be as free of germs as possible.  You can reduce the number of germs on your skin by washing with CHG (chlorahexidine gluconate) soap before surgery.  CHG is an antiseptic cleaner which kills germs and bonds with the skin to continue killing germs even after washing. Please DO NOT use if you have an allergy to CHG or antibacterial soaps.  If your skin becomes reddened/irritated stop using the CHG and inform your nurse when you arrive at Short Stay. Do not shave (including legs and underarms) for at least 48 hours prior to the first CHG shower.  You may shave your face/neck.  Please follow these instructions carefully:  1.  Shower with CHG Soap the night before surgery and the  morning of surgery.  2.  If you choose to wash your hair, wash your hair first as usual with your normal  shampoo.  3.  After you shampoo, rinse your hair and body thoroughly to remove the shampoo.  4.  Use CHG as you would any other liquid soap.  You can apply chg directly to the skin and wash.  Gently with a scrungie or clean  washcloth.  5.  Apply the CHG Soap to your body ONLY FROM THE NECK DOWN.   Do not use on face/ open                           Wound or open sores. Avoid contact with eyes, ears mouth and genitals (private parts).                       Wash face,  Genitals (private parts) with your normal soap.             6.  Wash thoroughly, paying special attention to the area where your  surgery  will be performed.  7.  Thoroughly rinse your body with warm water from the neck down.  8.  DO NOT shower/wash with your normal soap after using and rinsing off the CHG Soap.            9.  Pat yourself dry with a clean towel.            10.  Wear clean pajamas.            11.  Place clean sheets on your bed the night of your first shower and do not  sleep with pets.  ON THE DAY OF SURGERY : Do not apply any lotions/deodorants the morning of surgery.  Please wear clean clothes to the hospital/surgery center.    FAILURE TO FOLLOW THESE INSTRUCTIONS MAY RESULT IN THE CANCELLATION OF YOUR SURGERY  PATIENT SIGNATURE_________________________________  NURSE SIGNATURE__________________________________  ________________________________________________________________________       Charles Myers    An incentive spirometer is a tool that can help keep your lungs clear and active. This tool measures how well you are filling your lungs with each breath. Taking long deep breaths may help reverse or decrease the chance of developing breathing (pulmonary) problems (especially infection) following: A long period of time when you are unable to move or be active. BEFORE THE PROCEDURE  If the spirometer includes an indicator to show your best effort, your nurse or respiratory therapist will set it to a desired goal. If possible, sit up straight or lean slightly forward. Try not to slouch. Hold the incentive spirometer in an upright position. INSTRUCTIONS FOR USE  Sit on the edge of your bed if possible, or sit  up as far as you can in bed or on a chair. Hold the incentive spirometer in an upright position. Breathe out normally. Place the mouthpiece in your mouth and seal your lips tightly around it. Breathe in slowly and as deeply as possible, raising the piston or the ball toward the top of the column. Hold your breath for 3-5 seconds or for as long as possible. Allow the piston or ball to fall to the bottom of the column. Remove the mouthpiece from your mouth and breathe out normally. Rest for a few seconds and repeat Steps 1 through 7 at least 10 times every 1-2 hours when you are awake. Take your time and take a few normal breaths between deep breaths. The spirometer may include an indicator to show your best effort. Use the indicator as a goal to work toward during each repetition. After each set of 10 deep breaths, practice coughing to be  sure your lungs are clear. If you have an incision (the cut made at the time of surgery), support your incision when coughing by placing a pillow or rolled up towels firmly against it. Once you are able to get out of bed, walk around indoors and cough well. You may stop using the incentive spirometer when instructed by your caregiver.  RISKS AND COMPLICATIONS Take your time so you do not get dizzy or light-headed. If you are in pain, you may need to take or ask for pain medication before doing incentive spirometry. It is harder to take a deep breath if you are having pain. AFTER USE Rest and breathe slowly and easily. It can be helpful to keep track of a log of your progress. Your caregiver can provide you with a simple table to help with this. If you are using the spirometer at home, follow these instructions: SEEK MEDICAL CARE IF:  You are having difficultly using the spirometer. You have trouble using the spirometer as often as instructed. Your pain medication is not giving enough relief while using the spirometer. You develop fever of 100.5 F (38.1 C) or  higher.                                                                                                    SEEK IMMEDIATE MEDICAL CARE IF:  You cough up bloody sputum that had not been present before. You develop fever of 102 F (38.9 C) or greater. You develop worsening pain at or near the incision site. MAKE SURE YOU:  Understand these instructions. Will watch your condition. Will get help right away if you are not doing well or get worse. Document Released: 08/31/2006 Document Revised: 07/13/2011 Document Reviewed: 11/01/2006 [facility] Patient Information 2014 [place], [place].

## 2022-10-02 ENCOUNTER — Encounter (HOSPITAL_COMMUNITY)
Admission: RE | Admit: 2022-10-02 | Discharge: 2022-10-02 | Disposition: A | Discharge status: Home / Self Care | Payer: Medicare Other | Source: Ambulatory Visit | Attending: General Surgery | Admitting: General Surgery

## 2022-10-02 ENCOUNTER — Encounter (HOSPITAL_COMMUNITY): Payer: Self-pay

## 2022-10-02 ENCOUNTER — Other Ambulatory Visit: Payer: Self-pay

## 2022-10-02 VITALS — BP 129/84 | HR 77 | Temp 97.8°F | Resp 18 | Ht 72.0 in | Wt 208.4 lb

## 2022-10-02 DIAGNOSIS — Z01818 Encounter for other preprocedural examination: Secondary | ICD-10-CM | POA: Diagnosis present

## 2022-10-02 DIAGNOSIS — I2584 Coronary atherosclerosis due to calcified coronary lesion: Secondary | ICD-10-CM | POA: Insufficient documentation

## 2022-10-02 DIAGNOSIS — I2510 Atherosclerotic heart disease of native coronary artery without angina pectoris: Secondary | ICD-10-CM | POA: Diagnosis not present

## 2022-10-02 HISTORY — DX: Gastro-esophageal reflux disease without esophagitis: K21.9

## 2022-10-02 LAB — BASIC METABOLIC PANEL
Anion gap: 7 (ref 5–15)
BUN: 16 mg/dL (ref 8–23)
CO2: 26 mmol/L (ref 22–32)
Calcium: 9.2 mg/dL (ref 8.9–10.3)
Chloride: 106 mmol/L (ref 98–111)
Creatinine, Ser: 0.93 mg/dL (ref 0.61–1.24)
GFR, Estimated: >60 mL/min (ref >60)
Glucose, Bld: 110 mg/dL — ABNORMAL HIGH (ref 70–99)
Potassium: 4.5 mmol/L (ref 3.5–5.1)
Sodium: 139 mmol/L (ref 135–145)

## 2022-10-02 LAB — CBC
HCT: 42.2 % (ref 39.0–52.0)
Hemoglobin: 13.2 g/dL (ref 13.0–17.0)
MCH: 25.7 pg — ABNORMAL LOW (ref 26.0–34.0)
MCHC: 31.3 g/dL (ref 30.0–36.0)
MCV: 82.1 fL (ref 80.0–100.0)
Platelets: 211 10*3/uL (ref 150–400)
RBC: 5.14 MIL/uL (ref 4.22–5.81)
RDW: 18.1 % — ABNORMAL HIGH (ref 11.5–15.5)
WBC: 6 10*3/uL (ref 4.0–10.5)
nRBC: 0 % (ref 0.0–0.2)

## 2022-10-08 ENCOUNTER — Inpatient Hospital Stay (HOSPITAL_COMMUNITY): Payer: Medicare Other | Admitting: Certified Registered"

## 2022-10-08 ENCOUNTER — Other Ambulatory Visit: Payer: Self-pay

## 2022-10-08 ENCOUNTER — Inpatient Hospital Stay (HOSPITAL_COMMUNITY)
Admission: RE | Admit: 2022-10-08 | Discharge: 2022-10-13 | DRG: 346 | Disposition: A | Discharge status: Home / Self Care | Payer: Medicare Other | Attending: General Surgery | Admitting: General Surgery

## 2022-10-08 ENCOUNTER — Encounter (HOSPITAL_COMMUNITY): Payer: Self-pay | Admitting: General Surgery

## 2022-10-08 ENCOUNTER — Inpatient Hospital Stay (HOSPITAL_COMMUNITY): Payer: Medicare Other | Admitting: Physician Assistant

## 2022-10-08 ENCOUNTER — Encounter (HOSPITAL_COMMUNITY)
Admission: RE | Disposition: A | Discharge status: Home / Self Care | Payer: Self-pay | Source: Home / Self Care | Attending: General Surgery

## 2022-10-08 DIAGNOSIS — Z800 Family history of malignant neoplasm of digestive organs: Secondary | ICD-10-CM

## 2022-10-08 DIAGNOSIS — Z433 Encounter for attention to colostomy: Principal | ICD-10-CM

## 2022-10-08 DIAGNOSIS — F419 Anxiety disorder, unspecified: Secondary | ICD-10-CM | POA: Diagnosis present

## 2022-10-08 DIAGNOSIS — Z803 Family history of malignant neoplasm of breast: Secondary | ICD-10-CM

## 2022-10-08 DIAGNOSIS — I2510 Atherosclerotic heart disease of native coronary artery without angina pectoris: Secondary | ICD-10-CM | POA: Diagnosis present

## 2022-10-08 DIAGNOSIS — K660 Peritoneal adhesions (postprocedural) (postinfection): Secondary | ICD-10-CM

## 2022-10-08 DIAGNOSIS — K219 Gastro-esophageal reflux disease without esophagitis: Secondary | ICD-10-CM

## 2022-10-08 DIAGNOSIS — E785 Hyperlipidemia, unspecified: Secondary | ICD-10-CM | POA: Diagnosis present

## 2022-10-08 DIAGNOSIS — Z9852 Vasectomy status: Secondary | ICD-10-CM | POA: Diagnosis not present

## 2022-10-08 DIAGNOSIS — Z86718 Personal history of other venous thrombosis and embolism: Secondary | ICD-10-CM

## 2022-10-08 DIAGNOSIS — Z87891 Personal history of nicotine dependence: Secondary | ICD-10-CM

## 2022-10-08 DIAGNOSIS — H9190 Unspecified hearing loss, unspecified ear: Secondary | ICD-10-CM | POA: Diagnosis present

## 2022-10-08 DIAGNOSIS — Z933 Colostomy status: Secondary | ICD-10-CM | POA: Diagnosis present

## 2022-10-08 DIAGNOSIS — Z8719 Personal history of other diseases of the digestive system: Secondary | ICD-10-CM

## 2022-10-08 DIAGNOSIS — Z801 Family history of malignant neoplasm of trachea, bronchus and lung: Secondary | ICD-10-CM | POA: Diagnosis not present

## 2022-10-08 DIAGNOSIS — K5720 Diverticulitis of large intestine with perforation and abscess without bleeding: Secondary | ICD-10-CM | POA: Insufficient documentation

## 2022-10-08 DIAGNOSIS — K432 Incisional hernia without obstruction or gangrene: Secondary | ICD-10-CM | POA: Diagnosis present

## 2022-10-08 DIAGNOSIS — K5780 Diverticulitis of intestine, part unspecified, with perforation and abscess without bleeding: Secondary | ICD-10-CM | POA: Diagnosis not present

## 2022-10-08 HISTORY — PX: COLOSTOMY REVERSAL: SHX5782

## 2022-10-08 SURGERY — COLOSTOMY REVERSAL
Anesthesia: General

## 2022-10-08 MED ORDER — 0.9 % SODIUM CHLORIDE (POUR BTL) OPTIME
TOPICAL | Status: DC | PRN
  Administered 2022-10-08: 3000 mL

## 2022-10-08 MED ORDER — OXYCODONE HCL 5 MG/5ML PO SOLN: 5.0000 mg | Freq: Once | ORAL | Status: DC | PRN

## 2022-10-08 MED ORDER — LIDOCAINE HCL (CARDIAC) PF 100 MG/5ML IV SOSY
PREFILLED_SYRINGE | INTRAVENOUS | Status: DC | PRN
  Administered 2022-10-08: 40 mg via INTRAVENOUS

## 2022-10-08 MED ORDER — FENTANYL CITRATE (PF) 100 MCG/2ML IJ SOLN
INTRAMUSCULAR | Status: DC | PRN
  Administered 2022-10-08: 100 ug via INTRAVENOUS
  Administered 2022-10-08 (×2): 50 ug via INTRAVENOUS

## 2022-10-08 MED ORDER — ENOXAPARIN SODIUM 40 MG/0.4ML IJ SOSY
40.0000 mg | PREFILLED_SYRINGE | INTRAMUSCULAR | Status: DC
  Administered 2022-10-09 – 2022-10-13 (×5): 40 mg via SUBCUTANEOUS
  Filled 2022-10-08 (×5): qty 0.4

## 2022-10-08 MED ORDER — DEXAMETHASONE SODIUM PHOSPHATE 10 MG/ML IJ SOLN
INTRAMUSCULAR | Status: DC | PRN
  Administered 2022-10-08: 8 mg via INTRAVENOUS

## 2022-10-08 MED ORDER — SODIUM CHLORIDE 0.9 % IV SOLN
2.0000 g | Freq: Two times a day (BID) | INTRAVENOUS | Status: AC
  Administered 2022-10-08: 2 g via INTRAVENOUS
  Filled 2022-10-08: qty 2

## 2022-10-08 MED ORDER — LACTATED RINGERS IV SOLN: INTRAVENOUS | Status: DC

## 2022-10-08 MED ORDER — FENTANYL CITRATE (PF) 100 MCG/2ML IJ SOLN
INTRAMUSCULAR | Status: AC
  Filled 2022-10-08: qty 2

## 2022-10-08 MED ORDER — BUPIVACAINE HCL (PF) 0.25 % IJ SOLN
INTRAMUSCULAR | Status: DC | PRN
  Administered 2022-10-08: 50 mL

## 2022-10-08 MED ORDER — ONDANSETRON HCL 4 MG/2ML IJ SOLN
INTRAMUSCULAR | Status: DC | PRN
  Administered 2022-10-08: 4 mg via INTRAVENOUS

## 2022-10-08 MED ORDER — DEXAMETHASONE SODIUM PHOSPHATE 10 MG/ML IJ SOLN
INTRAMUSCULAR | Status: AC
  Filled 2022-10-08: qty 1

## 2022-10-08 MED ORDER — GABAPENTIN 100 MG PO CAPS
300.0000 mg | ORAL_CAPSULE | Freq: Two times a day (BID) | ORAL | Status: DC
  Administered 2022-10-08 – 2022-10-11 (×6): 300 mg via ORAL
  Filled 2022-10-08 (×6): qty 3

## 2022-10-08 MED ORDER — ROCURONIUM BROMIDE 10 MG/ML (PF) SYRINGE
PREFILLED_SYRINGE | INTRAVENOUS | Status: AC
  Filled 2022-10-08: qty 10

## 2022-10-08 MED ORDER — BUPIVACAINE HCL 0.25 % IJ SOLN
INTRAMUSCULAR | Status: AC
  Filled 2022-10-08: qty 1

## 2022-10-08 MED ORDER — HYDROMORPHONE HCL 1 MG/ML IJ SOLN
INTRAMUSCULAR | Status: DC | PRN
  Administered 2022-10-08: 1 mg via INTRAVENOUS
  Administered 2022-10-08: .5 mg via INTRAVENOUS

## 2022-10-08 MED ORDER — SODIUM CHLORIDE 0.9 % IV SOLN
2.0000 g | INTRAVENOUS | Status: AC
  Administered 2022-10-08: 2 g via INTRAVENOUS
  Filled 2022-10-08: qty 2

## 2022-10-08 MED ORDER — ORAL CARE MOUTH RINSE: 15.0000 mL | Freq: Once | OROMUCOSAL | Status: AC

## 2022-10-08 MED ORDER — ENSURE PRE-SURGERY PO LIQD: 592.0000 mL | Freq: Once | ORAL | Status: DC

## 2022-10-08 MED ORDER — FENTANYL CITRATE PF 50 MCG/ML IJ SOSY
PREFILLED_SYRINGE | INTRAMUSCULAR | Status: AC
  Filled 2022-10-08: qty 1

## 2022-10-08 MED ORDER — PANTOPRAZOLE SODIUM 40 MG PO TBEC
80.0000 mg | DELAYED_RELEASE_TABLET | Freq: Every day | ORAL | Status: DC
  Administered 2022-10-09 – 2022-10-12 (×4): 80 mg via ORAL
  Filled 2022-10-08 (×4): qty 2

## 2022-10-08 MED ORDER — POLYETHYLENE GLYCOL 3350 17 GM/SCOOP PO POWD: 1.0000 | Freq: Once | ORAL | Status: DC

## 2022-10-08 MED ORDER — PROPOFOL 10 MG/ML IV BOLUS
INTRAVENOUS | Status: AC
  Filled 2022-10-08: qty 20

## 2022-10-08 MED ORDER — ACETAMINOPHEN 10 MG/ML IV SOLN: 1000.0000 mg | Freq: Once | INTRAVENOUS | Status: DC | PRN

## 2022-10-08 MED ORDER — SIMETHICONE 80 MG PO CHEW: 40.0000 mg | CHEWABLE_TABLET | Freq: Four times a day (QID) | ORAL | Status: DC | PRN

## 2022-10-08 MED ORDER — ACETAMINOPHEN 160 MG/5ML PO SOLN: 325.0000 mg | ORAL | Status: DC | PRN

## 2022-10-08 MED ORDER — HYDROMORPHONE HCL 2 MG/ML IJ SOLN
INTRAMUSCULAR | Status: AC
  Filled 2022-10-08: qty 1

## 2022-10-08 MED ORDER — MIDAZOLAM HCL 2 MG/2ML IJ SOLN
INTRAMUSCULAR | Status: AC
  Filled 2022-10-08: qty 2

## 2022-10-08 MED ORDER — HYDROCODONE-ACETAMINOPHEN 5-325 MG PO TABS
1.0000 | ORAL_TABLET | ORAL | Status: DC | PRN
  Administered 2022-10-08 – 2022-10-13 (×22): 2 via ORAL
  Filled 2022-10-08 (×22): qty 2

## 2022-10-08 MED ORDER — ALVIMOPAN 12 MG PO CAPS
12.0000 mg | ORAL_CAPSULE | ORAL | Status: AC
  Administered 2022-10-08: 12 mg via ORAL
  Filled 2022-10-08: qty 1

## 2022-10-08 MED ORDER — DIPHENHYDRAMINE HCL 50 MG/ML IJ SOLN: 12.5000 mg | Freq: Four times a day (QID) | INTRAMUSCULAR | Status: DC | PRN

## 2022-10-08 MED ORDER — GABAPENTIN 300 MG PO CAPS
300.0000 mg | ORAL_CAPSULE | ORAL | Status: AC
  Administered 2022-10-08: 300 mg via ORAL
  Filled 2022-10-08: qty 1

## 2022-10-08 MED ORDER — BUPIVACAINE LIPOSOME 1.3 % IJ SUSP: 20.0000 mL | Freq: Once | INTRAMUSCULAR | Status: DC

## 2022-10-08 MED ORDER — AMISULPRIDE (ANTIEMETIC) 5 MG/2ML IV SOLN: 10.0000 mg | Freq: Once | INTRAVENOUS | Status: DC | PRN

## 2022-10-08 MED ORDER — ALVIMOPAN 12 MG PO CAPS
12.0000 mg | ORAL_CAPSULE | Freq: Two times a day (BID) | ORAL | Status: DC
  Administered 2022-10-09 – 2022-10-10 (×4): 12 mg via ORAL
  Filled 2022-10-08 (×4): qty 1

## 2022-10-08 MED ORDER — SUGAMMADEX SODIUM 200 MG/2ML IV SOLN
INTRAVENOUS | Status: DC | PRN
  Administered 2022-10-08: 390 mg via INTRAVENOUS

## 2022-10-08 MED ORDER — OXYCODONE HCL 5 MG PO TABS: 5.0000 mg | ORAL_TABLET | Freq: Once | ORAL | Status: DC | PRN

## 2022-10-08 MED ORDER — ONDANSETRON HCL 4 MG PO TABS: 4.0000 mg | ORAL_TABLET | Freq: Four times a day (QID) | ORAL | Status: DC | PRN

## 2022-10-08 MED ORDER — ENSURE PRE-SURGERY PO LIQD: 296.0000 mL | Freq: Once | ORAL | Status: DC

## 2022-10-08 MED ORDER — FENTANYL CITRATE PF 50 MCG/ML IJ SOSY
25.0000 ug | PREFILLED_SYRINGE | INTRAMUSCULAR | Status: DC | PRN
  Administered 2022-10-08: 50 ug via INTRAVENOUS

## 2022-10-08 MED ORDER — LIDOCAINE HCL (PF) 2 % IJ SOLN
INTRAMUSCULAR | Status: AC
  Filled 2022-10-08: qty 5

## 2022-10-08 MED ORDER — ONDANSETRON HCL 4 MG/2ML IJ SOLN
INTRAMUSCULAR | Status: AC
  Filled 2022-10-08: qty 2

## 2022-10-08 MED ORDER — DIPHENHYDRAMINE HCL 12.5 MG/5ML PO ELIX: 12.5000 mg | ORAL_SOLUTION | Freq: Four times a day (QID) | ORAL | Status: DC | PRN

## 2022-10-08 MED ORDER — SACCHAROMYCES BOULARDII 250 MG PO CAPS
250.0000 mg | ORAL_CAPSULE | Freq: Two times a day (BID) | ORAL | Status: DC
  Administered 2022-10-08 – 2022-10-12 (×9): 250 mg via ORAL
  Filled 2022-10-08 (×8): qty 1

## 2022-10-08 MED ORDER — ONDANSETRON HCL 4 MG/2ML IJ SOLN: 4.0000 mg | Freq: Four times a day (QID) | INTRAMUSCULAR | Status: DC | PRN

## 2022-10-08 MED ORDER — ALUM & MAG HYDROXIDE-SIMETH 200-200-20 MG/5ML PO SUSP: 30.0000 mL | Freq: Four times a day (QID) | ORAL | Status: DC | PRN

## 2022-10-08 MED ORDER — HEPARIN SODIUM (PORCINE) 5000 UNIT/ML IJ SOLN
5000.0000 [IU] | Freq: Once | INTRAMUSCULAR | Status: AC
  Administered 2022-10-08: 5000 [IU] via SUBCUTANEOUS
  Filled 2022-10-08: qty 1

## 2022-10-08 MED ORDER — ROCURONIUM BROMIDE 100 MG/10ML IV SOLN
INTRAVENOUS | Status: DC | PRN
  Administered 2022-10-08: 60 mg via INTRAVENOUS
  Administered 2022-10-08: 30 mg via INTRAVENOUS

## 2022-10-08 MED ORDER — BISACODYL 5 MG PO TBEC: 20.0000 mg | DELAYED_RELEASE_TABLET | Freq: Once | ORAL | Status: DC

## 2022-10-08 MED ORDER — CHLORHEXIDINE GLUCONATE 0.12 % MT SOLN
15.0000 mL | Freq: Once | OROMUCOSAL | Status: AC
  Administered 2022-10-08: 15 mL via OROMUCOSAL

## 2022-10-08 MED ORDER — PROPOFOL 10 MG/ML IV BOLUS
INTRAVENOUS | Status: DC | PRN
  Administered 2022-10-08: 130 mg via INTRAVENOUS

## 2022-10-08 MED ORDER — ATORVASTATIN CALCIUM 20 MG PO TABS
20.0000 mg | ORAL_TABLET | Freq: Every day | ORAL | Status: DC
  Administered 2022-10-08 – 2022-10-12 (×5): 20 mg via ORAL
  Filled 2022-10-08 (×5): qty 1

## 2022-10-08 MED ORDER — ACETAMINOPHEN 500 MG PO TABS
1000.0000 mg | ORAL_TABLET | ORAL | Status: AC
  Administered 2022-10-08: 1000 mg via ORAL
  Filled 2022-10-08: qty 2

## 2022-10-08 MED ORDER — HYDROMORPHONE HCL 1 MG/ML IJ SOLN
0.5000 mg | INTRAMUSCULAR | Status: DC | PRN
  Administered 2022-10-08 – 2022-10-09 (×3): 0.5 mg via INTRAVENOUS
  Filled 2022-10-08 (×3): qty 0.5

## 2022-10-08 MED ORDER — KCL IN DEXTROSE-NACL 20-5-0.45 MEQ/L-%-% IV SOLN
INTRAVENOUS | Status: DC
  Filled 2022-10-08 (×5): qty 1000

## 2022-10-08 MED ORDER — MIDAZOLAM HCL 5 MG/5ML IJ SOLN
INTRAMUSCULAR | Status: DC | PRN
  Administered 2022-10-08: 2 mg via INTRAVENOUS

## 2022-10-08 MED ORDER — ACETAMINOPHEN 325 MG PO TABS: 325.0000 mg | ORAL_TABLET | ORAL | Status: DC | PRN

## 2022-10-08 MED ORDER — PROMETHAZINE HCL 25 MG/ML IJ SOLN: 6.2500 mg | INTRAMUSCULAR | Status: DC | PRN

## 2022-10-08 MED ORDER — EZETIMIBE 10 MG PO TABS
10.0000 mg | ORAL_TABLET | Freq: Every day | ORAL | Status: DC
  Administered 2022-10-09 – 2022-10-12 (×4): 10 mg via ORAL
  Filled 2022-10-08 (×4): qty 1

## 2022-10-08 SURGICAL SUPPLY — 58 items
ADH SKN CLS APL DERMABOND .7 (GAUZE/BANDAGES/DRESSINGS) ×1
APL PRP STRL LF DISP 70% ISPRP (MISCELLANEOUS) ×1
BAG COUNTER SPONGE SURGICOUNT (BAG) IMPLANT
BAG SPNG CNTER NS LX DISP (BAG)
BLADE EXTENDED COATED 6.5IN (ELECTRODE) IMPLANT
CELLS DAT CNTRL 66122 CELL SVR (MISCELLANEOUS) ×1 IMPLANT
CHLORAPREP W/TINT 26 (MISCELLANEOUS) ×1 IMPLANT
COUNTER NEEDLE 1200 MAGNETIC (NEEDLE) ×1 IMPLANT
COVER MAYO STAND STRL (DRAPES) ×3 IMPLANT
DERMABOND ADVANCED .7 DNX12 (GAUZE/BANDAGES/DRESSINGS) ×1 IMPLANT
DRAIN CHANNEL 19F RND (DRAIN) IMPLANT
DRAPE LAPAROSCOPIC ABDOMINAL (DRAPES) ×1 IMPLANT
DRSG OPSITE POSTOP 4X10 (GAUZE/BANDAGES/DRESSINGS) IMPLANT
DRSG OPSITE POSTOP 4X6 (GAUZE/BANDAGES/DRESSINGS) IMPLANT
DRSG OPSITE POSTOP 4X8 (GAUZE/BANDAGES/DRESSINGS) IMPLANT
DRSG TEGADERM 2-3/8X2-3/4 SM (GAUZE/BANDAGES/DRESSINGS) IMPLANT
DRSG TELFA 3X8 NADH STRL (GAUZE/BANDAGES/DRESSINGS) IMPLANT
ELECT REM PT RETURN 15FT ADLT (MISCELLANEOUS) ×1 IMPLANT
EVACUATOR SILICONE 100CC (DRAIN) IMPLANT
GAUZE SPONGE 2X2 8PLY STRL LF (GAUZE/BANDAGES/DRESSINGS) IMPLANT
GAUZE SPONGE 4X4 12PLY STRL (GAUZE/BANDAGES/DRESSINGS) IMPLANT
GLOVE BIO SURGEON STRL SZ 6.5 (GLOVE) ×2 IMPLANT
GLOVE BIOGEL PI IND STRL 7.0 (GLOVE) ×2 IMPLANT
GLOVE INDICATOR 6.5 STRL GRN (GLOVE) ×1 IMPLANT
GOWN STRL REUS W/ TWL XL LVL3 (GOWN DISPOSABLE) ×6 IMPLANT
GOWN STRL REUS W/TWL XL LVL3 (GOWN DISPOSABLE) ×6
KIT TURNOVER KIT A (KITS) IMPLANT
LEGGING LITHOTOMY PAIR STRL (DRAPES) IMPLANT
PACK COLON (CUSTOM PROCEDURE TRAY) ×1 IMPLANT
PAD POSITIONING PINK XL (MISCELLANEOUS) ×1 IMPLANT
PENCIL SMOKE EVACUATOR (MISCELLANEOUS) IMPLANT
PROTECTOR NERVE ULNAR (MISCELLANEOUS) ×1 IMPLANT
RETRACTOR WND ALEXIS 18 MED (MISCELLANEOUS) IMPLANT
RTRCTR WOUND ALEXIS 18CM MED (MISCELLANEOUS) ×1
SEALER TISSUE G2 STRG ARTC 35C (ENDOMECHANICALS) IMPLANT
SIGMOIDOSCOPE DISPOSABLE (MISCELLANEOUS) IMPLANT
SPIKE FLUID TRANSFER (MISCELLANEOUS) ×1 IMPLANT
STAPLER ECHELON POWER CIR 29 (STAPLE) IMPLANT
STAPLER VISISTAT 35W (STAPLE) ×1 IMPLANT
SURGILUBE 2OZ TUBE FLIPTOP (MISCELLANEOUS) ×1 IMPLANT
SUT ETHILON 2 0 PS N (SUTURE) ×2 IMPLANT
SUT NOVA NAB DX-16 0-1 5-0 T12 (SUTURE) ×2 IMPLANT
SUT NOVA NAB GS-21 1 T12 (SUTURE) IMPLANT
SUT PDS AB 1 TP1 96 (SUTURE) IMPLANT
SUT PROLENE 2 0 KS (SUTURE) ×1 IMPLANT
SUT SILK 2 0 (SUTURE) ×1
SUT SILK 2 0 SH CR/8 (SUTURE) ×1 IMPLANT
SUT SILK 2-0 18XBRD TIE 12 (SUTURE) ×1 IMPLANT
SUT SILK 3 0 (SUTURE) ×1
SUT SILK 3 0 SH CR/8 (SUTURE) ×1 IMPLANT
SUT SILK 3-0 18XBRD TIE 12 (SUTURE) ×1 IMPLANT
SUT VIC AB 2-0 SH 18 (SUTURE) ×1 IMPLANT
SUT VIC AB 4-0 PS2 27 (SUTURE) ×1 IMPLANT
SUT VICRYL 0 UR6 27IN ABS (SUTURE) IMPLANT
TAPE PAPER 1X10 WHT MICROPORE (GAUZE/BANDAGES/DRESSINGS) IMPLANT
TOWEL OR NON WOVEN STRL DISP B (DISPOSABLE) ×1 IMPLANT
TRAY FOLEY MTR SLVR 16FR STAT (SET/KITS/TRAYS/PACK) IMPLANT
TUBING CONNECTING 10 (TUBING) ×1 IMPLANT

## 2022-10-08 NOTE — Anesthesia Procedure Notes (Signed)
Procedure Name: Intubation Date/Time: 10/08/2022 9:43 AM  Performed by: Oletha Cruel, CRNAPre-anesthesia Checklist: Patient identified, Emergency Drugs available, Suction available, Patient being monitored and Timeout performed Patient Re-evaluated:Patient Re-evaluated prior to induction Oxygen Delivery Method: Circle system utilized Preoxygenation: Pre-oxygenation with 100% oxygen Induction Type: IV induction Ventilation: Mask ventilation without difficulty Laryngoscope Size: Mac and 4 Grade View: Grade II Tube type: Oral Tube size: 7.5 mm Number of attempts: 1 Airway Equipment and Method: Stylet Placement Confirmation: ETT inserted through vocal cords under direct vision, positive ETCO2 and breath sounds checked- equal and bilateral Secured at: 23 cm Tube secured with: Tape Dental Injury: Teeth and Oropharynx as per pre-operative assessment

## 2022-10-08 NOTE — Anesthesia Preprocedure Evaluation (Addendum)
Anesthesia Evaluation  Patient identified by MRN, date of birth, ID band Patient awake    Reviewed: Allergy & Precautions, NPO status , Patient's Chart, lab work & pertinent test results  Airway Mallampati: II  TM Distance: >3 FB Neck ROM: Full    Dental  (+) Teeth Intact, Dental Advisory Given, Caps   Pulmonary former smoker   breath sounds clear to auscultation       Cardiovascular + CAD   Rhythm:Regular Rate:Normal     Neuro/Psych   Anxiety     negative neurological ROS     GI/Hepatic Neg liver ROS,GERD  Medicated,,  Endo/Other  negative endocrine ROS    Renal/GU negative Renal ROS     Musculoskeletal negative musculoskeletal ROS (+)    Abdominal   Peds  Hematology negative hematology ROS (+)   Anesthesia Other Findings   Reproductive/Obstetrics                             Anesthesia Physical Anesthesia Plan  ASA: 2  Anesthesia Plan: General   Post-op Pain Management: Tylenol PO (pre-op)* and Gabapentin PO (pre-op)*   Induction: Intravenous  PONV Risk Score and Plan: 3 and Ondansetron, Dexamethasone and Midazolam  Airway Management Planned: Oral ETT  Additional Equipment: None  Intra-op Plan:   Post-operative Plan: Extubation in OR  Informed Consent: I have reviewed the patients History and Physical, chart, labs and discussed the procedure including the risks, benefits and alternatives for the proposed anesthesia with the patient or authorized representative who has indicated his/her understanding and acceptance.     Dental advisory given  Plan Discussed with: CRNA  Anesthesia Plan Comments:        Anesthesia Quick Evaluation

## 2022-10-08 NOTE — Anesthesia Postprocedure Evaluation (Signed)
Anesthesia Post Note  Patient: MD. HOOS  Procedure(s) Performed: OPEN COLOSTOMY REVERSAL, CLOSURE OF COLOSTOMY     Patient location during evaluation: PACU Anesthesia Type: General Level of consciousness: awake and alert Pain management: pain level controlled Vital Signs Assessment: post-procedure vital signs reviewed and stable Respiratory status: spontaneous breathing, nonlabored ventilation, respiratory function stable and patient connected to nasal cannula oxygen Cardiovascular status: blood pressure returned to baseline and stable Postop Assessment: no apparent nausea or vomiting Anesthetic complications: no  No notable events documented.  Last Vitals:  Vitals:   10/08/22 1245 10/08/22 1313  BP: 132/73 (!) 140/70  Pulse: 83 87  Resp: 15 18  Temp:  37 C  SpO2: 95% 95%    Last Pain:  Vitals:   10/08/22 1313  TempSrc: Oral  PainSc:                  [name]

## 2022-10-08 NOTE — Op Note (Signed)
10/08/2022  11:48 AM  PATIENT:  Charles Myers  67 y.o. male  Patient Care Team: Farris Has, MD as PCP - General (Family Medicine) Nahser, Deloris Ping, MD as PCP - Cardiology (Cardiology)  PRE-OPERATIVE DIAGNOSIS:  DIVERTICULITIS, COLOSTOMY IN PLACE  POST-OPERATIVE DIAGNOSIS:  DIVERTICULITIS, COLOSTOMY IN PLACE  PROCEDURE: OPEN COLOSTOMY REVERSAL, CLOSURE OF COLOSTOMY, INCISIONAL HERNIA REPAIR    Surgeon(s): Romie Levee, MD Andria Meuse, MD  ASSISTANT: Dr Cliffton Asters   ANESTHESIA:   local and general  EBL:  Total I/O In: 100 [IV Piggyback:100] Out: 150 [Urine:100; Blood:50]  DRAINS: (35F) Jackson-Pratt drain(s) with closed bulb suction in the SQ space    SPECIMEN:  Source of Specimen:  colostomy  DISPOSITION OF SPECIMEN:  PATHOLOGY  COUNTS:  YES  PLAN OF CARE: Admit to inpatient   PATIENT DISPOSITION:  PACU - hemodynamically stable.  INDICATION: 67 y.o. M undergoing emergent surgery in [place] for perforated diverticulitis requiring Hartman's procedure and colostomy.  He is here for colostomy takedown   OR FINDINGS: Significant filmy adhesions within the pelvis and abdomen.  DESCRIPTION: the patient was identified in the preoperative holding area and taken to the OR where they were laid supine on the operating room table.  General anesthesia was induced without difficulty. SCDs were also noted to be in place prior to the initiation of anesthesia.  The patient was then prepped and draped in the usual sterile fashion.   A surgical timeout was performed indicating the correct patient, procedure, positioning and need for preoperative antibiotics.   I began by making an incision around the patient's previous scar using a 10 blade scalpel.  This was resected from the subcutaneous tissues using electrocautery.  I then divided down into the subcutaneous tissue until I entered the hernia sac.  The abdomen was opened through the hernia sac using electrocautery.  Mental  adhesions were taken down using blunt dissection and electrocautery.  Patient was noted to have a large incisional hernia approximately 20 cm x 7 cm.  I bluntly mobilized the small bowel out of the pelvis and packed this into the right lower quadrant.  The rectal stump was identified from surrounding adhesions using blunt dissection.  There did not appear to sigmoid to rectum.  I mobilized the left lower quadrant to accommodate the remaining colon.  I then began to take down the colostomy.  An incision was made around the mucocutaneous junction.  This was carried down through the subcutaneous levels to the layer of the fascia using electrocautery.  The colon was mobilized from the fascia and divided blunt dissection and cautery.  Once the entire colon was freed circumferentially, several loops of small bowel were adherent to the colon.  These were taken down and placed back into the abdomen.  I identified a portion of the colon that was free from diverticular disease.  The mesentery was tied off with a 2-0 silk suture x 2.  The remaining colon was divided over a pursestring device.  Left naris placed and secured with interrupted 3-0 silk sutures.  A 29 mm EEA anvil was placed into the colon and the pursestring was tied tightly around this.  This was then brought through the fascia and placed back into the abdomen.  It easily reached to the rectal stump without any further immobilization.  We then dilated the rectal stump using a rectal dilators.  A 29 mm EEA was placed into the rectum and brought out just distal to the staple line in reverse  Baker type anastomosis.  The anastomosis was tested with insufflation under irrigation.  There was no sign of leak.  There was no tension on the anastomosis.  The anastomosis rests approximately 20 cm from the anal verge.  Irrigated completely normal saline.  All sponges were removed and counts confirmed this.  We switched to clean gloves and gowns.  I then identified the  fascia and tissue was mobilized to the midline.  The hernia sac was excised.  This was also done in the colostomy site.  We then closed the colostomy using running #1 Novafil sutures x 2 to close the fascia.  I then turned my attention to the midline wound.  The peritoneum was closed using a running 0 Vicryl suture.  The fascia was closed 2, running #1 Novafil sutures.  A 19 [place] Blake drain was then placed over the fascia and brought out through the right lower quadrant and secured with a 2-0 nylon suture.  The dermal layer was then closed over this using a running 2-0 Vicryl suture and the skin was closed with staples.  The colostomy wound was partially closed with 2, 2-0 Vicryl pursestring sutures.  A Telfa sponge was placed in the middle of this wound and both wounds were covered with a dressing.  The patient is from anesthesia and sent to the postanesthesia care unit in stable condition.  All counts were correct per operating room staff.  Vanita Panda, MD  Colorectal and General Surgery [facility] Surgery

## 2022-10-08 NOTE — Interval H&P Note (Signed)
History and Physical Interval Note:  10/08/2022 7:16 AM  Charles Myers  has presented today for surgery, with the diagnosis of DIVERTICULITIS, COLOSTOMY IN PLACE.  The various methods of treatment have been discussed with the patient and family. After consideration of risks, benefits and other options for treatment, the patient has consented to  Procedure(s): OPEN COLOSTOMY REVERSAL (N/A) as a surgical intervention.  The patient's history has been reviewed, patient examined, no change in status, stable for surgery.  I have reviewed the patient's chart and labs.  Questions were answered to the patient's satisfaction.     Vanita Panda, MD  Colorectal and General Surgery [facility] Surgery

## 2022-10-08 NOTE — Transfer of Care (Signed)
Immediate Anesthesia Transfer of Care Note  Patient: Charles Myers AGE  Procedure(s) Performed: OPEN COLOSTOMY REVERSAL, CLOSURE OF COLOSTOMY  Patient Location: PACU  Anesthesia Type:General  Level of Consciousness: drowsy and patient cooperative  Airway & Oxygen Therapy: Patient Spontanous Breathing and Patient connected to face mask oxygen  Post-op Assessment: Report given to RN and Post -op Vital signs reviewed and stable  Post vital signs: Reviewed and stable  Last Vitals:  Vitals Value Taken Time  BP 128/79 10/08/22 1156  Temp    Pulse 81 10/08/22 1158  Resp 19 10/08/22 1158  SpO2 87 % 10/08/22 1158  Vitals shown include unvalidated device data.  Last Pain:  Vitals:   10/08/22 0745  TempSrc:   PainSc: 0-No pain      Patients Stated Pain Goal: 4 (10/08/22 0745)  Complications: No notable events documented.

## 2022-10-09 ENCOUNTER — Encounter (HOSPITAL_COMMUNITY): Payer: Self-pay | Admitting: General Surgery

## 2022-10-09 LAB — CBC
HCT: 38.3 % — ABNORMAL LOW (ref 39.0–52.0)
Hemoglobin: 12.2 g/dL — ABNORMAL LOW (ref 13.0–17.0)
MCH: 26.5 pg (ref 26.0–34.0)
MCHC: 31.9 g/dL (ref 30.0–36.0)
MCV: 83.1 fL (ref 80.0–100.0)
Platelets: 217 10*3/uL (ref 150–400)
RBC: 4.61 MIL/uL (ref 4.22–5.81)
RDW: 18.6 % — ABNORMAL HIGH (ref 11.5–15.5)
WBC: 12.4 10*3/uL — ABNORMAL HIGH (ref 4.0–10.5)
nRBC: 0 % (ref 0.0–0.2)

## 2022-10-09 LAB — BASIC METABOLIC PANEL
Anion gap: 7 (ref 5–15)
BUN: 11 mg/dL (ref 8–23)
CO2: 24 mmol/L (ref 22–32)
Calcium: 8.8 mg/dL — ABNORMAL LOW (ref 8.9–10.3)
Chloride: 106 mmol/L (ref 98–111)
Creatinine, Ser: 0.81 mg/dL (ref 0.61–1.24)
GFR, Estimated: >60 mL/min (ref >60)
Glucose, Bld: 142 mg/dL — ABNORMAL HIGH (ref 70–99)
Potassium: 4 mmol/L (ref 3.5–5.1)
Sodium: 137 mmol/L (ref 135–145)

## 2022-10-09 LAB — SURGICAL PATHOLOGY

## 2022-10-09 NOTE — Progress Notes (Signed)
1 Day Post-Op open colostomy reversal and VHR Subjective: Pain controlled.  Ambulating.  Objective: Vital signs in last 24 hours: Temp:  [97.4 F (36.3 C)-98.8 F (37.1 C)] 98.8 F (37.1 C) (06/07 0615) Pulse Rate:  [65-101] 98 (06/07 0615) Resp:  [11-18] 18 (06/07 0615) BP: (109-144)/(69-88) 109/69 (06/07 0615) SpO2:  [94 %-98 %] 95 % (06/07 0615) FiO2 (%):  [2 %] 2 % (06/06 1615) Weight:  [94.5 kg] 94.5 kg (06/06 0745)   Intake/Output from previous day: 06/06 0701 - 06/07 0700 In: 1952.4 [P.O.:60; I.V.:1792.4; IV Piggyback:100] Out: 1815 [Urine:1700; Drains:65; Blood:50] Intake/Output this shift: No intake/output data recorded.   General appearance: alert and cooperative GI: soft, non-distended  Incision: no significant drainage  Lab Results:  Recent Labs    10/09/22 0502  WBC 12.4*  HGB 12.2*  HCT 38.3*  PLT 217   BMET Recent Labs    10/09/22 0502  NA 137  K 4.0  CL 106  CO2 24  GLUCOSE 142*  BUN 11  CREATININE 0.81  CALCIUM 8.8*   PT/INR No results for input(s): "LABPROT", "INR" in the last 72 hours. ABG No results for input(s): "PHART", "HCO3" in the last 72 hours.  Invalid input(s): "PCO2", "PO2"  MEDS, Scheduled  alvimopan  12 mg Oral BID   atorvastatin  20 mg Oral q1800   enoxaparin (LOVENOX) injection  40 mg Subcutaneous Q24H   ezetimibe  10 mg Oral Daily   gabapentin  300 mg Oral BID   pantoprazole  80 mg Oral Daily   saccharomyces boulardii  250 mg Oral BID    Studies/Results: No results found.  Assessment: s/p Procedure(s): OPEN COLOSTOMY REVERSAL, CLOSURE OF COLOSTOMY Patient Active Problem List   Diagnosis Date Noted   Colostomy in place [facility]) 10/08/2022   Nonhealing nonsurgical wound 07/17/2022   Irritant contact dermatitis associated with fecal stoma 07/17/2022   Colostomy complication (HCC) 07/17/2022   Coronary artery calcification 11/14/2015   Hyperlipidemia 11/14/2015    Expected post op course  Plan: Await  return of bowel function Cont IVF's Clears when pt has flatus   LOS: 1 day     .Vanita Panda, MD [facility] Surgery, [place]    10/09/2022 7:40 AM

## 2022-10-09 NOTE — Progress Notes (Signed)
   10/09/22 1102  TOC Brief Assessment  Insurance and Status Reviewed  Patient has primary care physician Yes  Home environment has been reviewed home with spouse  Prior level of function: independent  Prior/Current Home Services No current home services  Social Determinants of Health Reivew SDOH reviewed no interventions necessary  Readmission risk has been reviewed Yes  Transition of care needs no transition of care needs at this time

## 2022-10-10 ENCOUNTER — Encounter (HOSPITAL_COMMUNITY): Payer: Self-pay | Admitting: General Surgery

## 2022-10-10 DIAGNOSIS — K219 Gastro-esophageal reflux disease without esophagitis: Secondary | ICD-10-CM

## 2022-10-10 DIAGNOSIS — Z8719 Personal history of other diseases of the digestive system: Secondary | ICD-10-CM

## 2022-10-10 DIAGNOSIS — Z86718 Personal history of other venous thrombosis and embolism: Secondary | ICD-10-CM

## 2022-10-10 DIAGNOSIS — F419 Anxiety disorder, unspecified: Secondary | ICD-10-CM

## 2022-10-10 LAB — CBC
HCT: 34.4 % — ABNORMAL LOW (ref 39.0–52.0)
Hemoglobin: 10.8 g/dL — ABNORMAL LOW (ref 13.0–17.0)
MCH: 26.1 pg (ref 26.0–34.0)
MCHC: 31.4 g/dL (ref 30.0–36.0)
MCV: 83.1 fL (ref 80.0–100.0)
Platelets: 156 10*3/uL (ref 150–400)
RBC: 4.14 MIL/uL — ABNORMAL LOW (ref 4.22–5.81)
RDW: 18.5 % — ABNORMAL HIGH (ref 11.5–15.5)
WBC: 8.8 10*3/uL (ref 4.0–10.5)
nRBC: 0 % (ref 0.0–0.2)

## 2022-10-10 LAB — BASIC METABOLIC PANEL
Anion gap: 7 (ref 5–15)
BUN: 14 mg/dL (ref 8–23)
CO2: 26 mmol/L (ref 22–32)
Calcium: 8.4 mg/dL — ABNORMAL LOW (ref 8.9–10.3)
Chloride: 102 mmol/L (ref 98–111)
Creatinine, Ser: 0.8 mg/dL (ref 0.61–1.24)
GFR, Estimated: >60 mL/min (ref >60)
Glucose, Bld: 106 mg/dL — ABNORMAL HIGH (ref 70–99)
Potassium: 3.7 mmol/L (ref 3.5–5.1)
Sodium: 135 mmol/L (ref 135–145)

## 2022-10-10 MED ORDER — METOCLOPRAMIDE HCL 5 MG/ML IJ SOLN: 5.0000 mg | Freq: Three times a day (TID) | INTRAMUSCULAR | Status: DC | PRN

## 2022-10-10 MED ORDER — PROCHLORPERAZINE EDISYLATE 10 MG/2ML IJ SOLN: 5.0000 mg | INTRAMUSCULAR | Status: DC | PRN

## 2022-10-10 MED ORDER — LACTATED RINGERS IV BOLUS: 1000.0000 mL | Freq: Three times a day (TID) | INTRAVENOUS | Status: DC | PRN

## 2022-10-10 MED ORDER — LIP MEDEX EX OINT
TOPICAL_OINTMENT | Freq: Two times a day (BID) | CUTANEOUS | Status: DC
  Administered 2022-10-11: 75 via TOPICAL
  Filled 2022-10-10: qty 7

## 2022-10-10 MED ORDER — MAGIC MOUTHWASH: 15.0000 mL | Freq: Four times a day (QID) | ORAL | Status: DC | PRN

## 2022-10-10 MED ORDER — MENTHOL 3 MG MT LOZG: 1.0000 | LOZENGE | OROMUCOSAL | Status: DC | PRN

## 2022-10-10 MED ORDER — CALCIUM POLYCARBOPHIL 625 MG PO TABS
625.0000 mg | ORAL_TABLET | Freq: Two times a day (BID) | ORAL | Status: DC
  Administered 2022-10-10 – 2022-10-12 (×6): 625 mg via ORAL
  Filled 2022-10-10 (×6): qty 1

## 2022-10-10 MED ORDER — METHOCARBAMOL 1000 MG/10ML IJ SOLN: 1000.0000 mg | Freq: Four times a day (QID) | INTRAVENOUS | Status: DC | PRN

## 2022-10-10 MED ORDER — PHENOL 1.4 % MT LIQD: 2.0000 | OROMUCOSAL | Status: DC | PRN

## 2022-10-10 MED ORDER — METHOCARBAMOL 500 MG PO TABS: 1000.0000 mg | ORAL_TABLET | Freq: Four times a day (QID) | ORAL | Status: DC | PRN

## 2022-10-10 NOTE — Progress Notes (Signed)
10/10/2022  Charles Myers [id number] 1955-08-28  CARE TEAM: PCP: Farris Has, MD  Outpatient Care Team: Patient Care Team: Farris Has, MD as PCP - General (Family Medicine) Nahser, Deloris Ping, MD as PCP - Cardiology (Cardiology)  Inpatient Treatment Team: Treatment Team: Attending Provider: Romie Levee, MD; Registered Nurse: Vallarie Mare, RN; Technician: Harlow Ohms, NT; Utilization Review: Bess Kinds, RN; Social Worker: Larrie Kass, LCSW   Problem List:   Principal Problem:   Colostomy in place [facility]) Active Problems:   Hyperlipidemia   GERD (gastroesophageal reflux disease)   Anxiety   History of diverticulitis of colon   Diverticulitis of large intestine with perforation and abscess   History of DVT (deep vein thrombosis)   10/08/2022  POST-OPERATIVE DIAGNOSIS:  DIVERTICULITIS, COLOSTOMY IN PLACE   PROCEDURE:  OPEN COLOSTOMY REVERSAL INCISIONAL HERNIA REPAIR   Surgeon:  Romie Levee, MD  OR FINDINGS: Significant filmy adhesions within the pelvis and abdomen   Assessment [facility] Stay = 2 days) 2 Days Post-Op    Okay so far    Plan:  ERAS protocol  Significant lysis of adhesions and repair.  High risk of postop ileus = keep on sips for now.  Once has definite flatus we will advance per protocol.  Continue alvimopan as needed to minimize.  IV fluids to avoid dehydration.  GERD = PPI  Hyperlipidemia = atorvastatin, ezetimibe  Pain control.  Remove wick in old colostomy site postop day #2 = 6/9 Sunday  -VTE prophylaxis- SCDs, enoxaparin etc  -mobilize as tolerated to help recovery  -Disposition:  Disposition:  The patient is from: Home  Anticipate discharge to:  Home  Anticipated Date of Discharge is:  June 12,2024    Barriers to discharge:  Consultant clearance & sign off   and Pending Clinical improvement (more likely than not)  Patient currently is NOT MEDICALLY STABLE for discharge from the hospital from  a surgery standpoint.      I reviewed last 24 h vitals and pain scores, last 48 h intake and output, last 24 h labs and trends, and last 24 h imaging results.  I have reviewed this patient's available data, including medical history, events of note, test results, etc as part of my evaluation.   A significant portion of that time was spent in counseling. Care during the described time interval was provided by me.  This care required moderate level of medical decision making.  10/10/2022    Subjective: (Chief complaint)  Nursing in room.  Patient with some soreness.  Tolerating that sips.  Not much appetite.  No nausea or vomiting.  Feels "gurgling" in his abdomen.  No belching or flatus yet  Objective:  Vital signs:  Vitals:   10/09/22 0850 10/09/22 1244 10/09/22 2006 10/10/22 0506  BP: 104/68 107/72 115/63 131/86  Pulse: 89 100 98 96  Resp: 18 18 17 17   Temp: 97.9 F (36.6 C) 97.9 F (36.6 C) 98.2 F (36.8 C) 97.9 F (36.6 C)  TempSrc: Oral Oral Oral Oral  SpO2: 95% 94% 93% 94%  Weight:    97.8 kg  Height:        Last BM Date : 10/07/22  Intake/Output   Yesterday:  06/07 0701 - 06/08 0700 In: 1910 [P.O.:150; I.V.:1760] Out: 1275 [Urine:1200; Drains:75] This shift:  No intake/output data recorded.  Bowel function:  Flatus: No  BM:  No  Drain: (No drain)   Physical Exam:  General: Pt awake/alert in no acute distress Eyes: PERRL, normal  EOM.  Sclera clear.  No icterus Neuro: CN II-XII intact w/o focal sensory/motor deficits. Lymph: No head/neck/groin lymphadenopathy Psych:  No delerium/psychosis/paranoia.  Oriented x 4 HENT: Normocephalic, Mucus membranes moist.  No thrush Neck: Supple, No tracheal deviation.  No obvious thyromegaly Chest: No pain to chest wall compression.  Good respiratory excursion.  No audible wheezing CV:  Pulses intact.  Regular rhythm.  No major extremity edema MS: Normal AROM mjr joints.  No obvious deformity  Abdomen:  Soft.  Nondistended.  Mildly tender at incisions only.  Colostomy with scant serosanguineous drainage.  No evidence of peritonitis.  No incarcerated hernias.  Ext:   No deformity.  No mjr edema.  No cyanosis Skin: No petechiae / purpurea.  No major sores.  Warm and dry    Results:   Cultures: No results found for this or any previous visit (from the past 720 hour(s)).  Labs: Results for orders placed or performed during the hospital encounter of 10/08/22 (from the past 48 hour(s))  Surgical pathology     Status: None   Collection Time: 10/08/22 11:03 AM  Result Value Ref Range   SURGICAL PATHOLOGY      SURGICAL PATHOLOGY CASE: [id number] PATIENT: Verdis Prime Surgical Pathology Report     Clinical History: Diverticulitis, colostomy in place (crm)     FINAL MICROSCOPIC DIAGNOSIS:  A. OSTOMY:      Enterocutaneous tissue with chronic inflammation and reactive epithelial changes consistent with ostomy site changes.      Negative for dysplasia or malignancy.   Caidan Hubbert DESCRIPTION:  Received fresh and clinically identified as "ostomy" is an 8 cm length of intestine surrounded by soft fatty tissue.  One end has a rim of pink-white skin with adjacent tan-pink to dark red smooth to granular mucosa, consistent with a colostomy.  The opposite end is open and has grossly viable mucosa/tissue free of lesions.  On opening, there is tan-pink smooth, soft mucosa with normal intestinal folds.  The wall is up to 1.1 cm thick and has few unperforated diverticula which contain dark brown firm fecal matter.  There are no mass lesions. Representative sections which include  colostomy end and a diverticulum are submitted in 1 block.  SW 10/08/2022   Final Diagnosis performed by Lance Coon, MD.   Electronically signed 10/09/2022 Technical component performed at [facility], [address], [place], [place] [address].  Professional component performed at [employer].  [facility], [address] [place], [place] [address].  Immunohistochemistry Technical component (if applicable) was performed at [facility]. [address], [place], [place] [address].   IMMUNOHISTOCHEMISTRY DISCLAIMER (if applicable): Some of these immunohistochemical stains may have been developed and the performance characteristics determine by [facility]. Some may not have been cleared or approved by the U.S. Food and Drug Administration. The FDA has determined that such clearance or approval is not necessary. This test is used for clinical purposes. It should not be regarded as investigational or for rese arch. This laboratory is certified under the Clinical Laboratory Improvement Amendments of 1988 (CLIA-88) as qualified to perform high complexity clinical laboratory testing.  The controls stained appropriately.   IHC stains are performed on formalin fixed, paraffin embedded tissue using a 3,3"diaminobenzidine (DAB) chromogen and Leica Bond Autostainer System. The staining intensity of the nucleus is score manually and is reported as the percentage of tumor cell nuclei demonstrating specific nuclear staining. The specimens are fixed in 10% Neutral Formalin for  at least 6 hours and up to 72hrs. These tests are validated on decalcified tissue. Results should be interpreted with caution given the possibility of false negative results on decalcified specimens. Antibody Clones are as follows ER-clone 26F, PR-clone 16, Ki67- clone MM1. Some of these immunohistochemical stains may have been developed and the performance characteristics determined by [facility].   CBC     Status: Abnormal   Collection Time: 10/09/22  5:02 AM  Result Value Ref Range   WBC 12.4 (H) 4.0 - 10.5 K/uL   RBC 4.61 4.22 - 5.81 MIL/uL   Hemoglobin 12.2 (L) 13.0 - 17.0 g/dL   HCT 82.9 (L) 56.2 - 13.0 %   MCV 83.1 80.0 - 100.0 fL   MCH 26.5 26.0 -  34.0 pg   MCHC 31.9 30.0 - 36.0 g/dL   RDW 86.5 (H) 78.4 - 69.6 %   Platelets 217 150 - 400 K/uL   nRBC 0.0 0.0 - 0.2 %    Comment: Performed at [facility], [address], [place], [place] [address]  Basic metabolic panel     Status: Abnormal   Collection Time: 10/09/22  5:02 AM  Result Value Ref Range   Sodium 137 135 - 145 mmol/L   Potassium 4.0 3.5 - 5.1 mmol/L   Chloride 106 98 - 111 mmol/L   CO2 24 22 - 32 mmol/L   Glucose, Bld 142 (H) 70 - 99 mg/dL    Comment: Glucose reference range applies only to samples taken after fasting for at least 8 hours.   BUN 11 8 - 23 mg/dL   Creatinine, Ser 4.13 0.61 - 1.24 mg/dL   Calcium 8.8 (L) 8.9 - 10.3 mg/dL   GFR, Estimated >24 >40 mL/min    Comment: (NOTE) Calculated using the CKD-EPI Creatinine Equation (2021)    Anion gap 7 5 - 15    Comment: Performed at [facility], [address] [place], [place] [address]  CBC     Status: Abnormal   Collection Time: 10/10/22  5:32 AM  Result Value Ref Range   WBC 8.8 4.0 - 10.5 K/uL   RBC 4.14 (L) 4.22 - 5.81 MIL/uL   Hemoglobin 10.8 (L) 13.0 - 17.0 g/dL   HCT 53.6 (L) 64.4 - 03.4 %   MCV 83.1 80.0 - 100.0 fL   MCH 26.1 26.0 - 34.0 pg   MCHC 31.4 30.0 - 36.0 g/dL   RDW 74.2 (H) 59.5 - 63.8 %   Platelets 156 150 - 400 K/uL   nRBC 0.0 0.0 - 0.2 %    Comment: Performed at [facility], [address] [place], [place] [address]  Basic metabolic panel     Status: Abnormal   Collection Time: 10/10/22  5:32 AM  Result Value Ref Range   Sodium 135 135 - 145 mmol/L   Potassium 3.7 3.5 - 5.1 mmol/L   Chloride 102 98 - 111 mmol/L   CO2 26 22 - 32 mmol/L   Glucose, Bld 106 (H) 70 - 99 mg/dL    Comment: Glucose reference range applies only to samples taken after fasting for at least 8 hours.   BUN 14 8 - 23 mg/dL   Creatinine, Ser 3.29 0.61 - 1.24 mg/dL   Calcium 8.4 (L) 8.9 - 10.3 mg/dL   GFR, Estimated >51 >88 mL/min    Comment:  (NOTE) Calculated using the CKD-EPI Creatinine Equation (2021)    Anion gap 7 5 - 15  Comment: Performed at [facility], [address] [place], [place] [address]    Imaging / Studies: No results found.  Medications / Allergies: per chart  Antibiotics: Anti-infectives (From admission, onward)    Start     Dose/Rate Route Frequency Ordered Stop   10/08/22 2200  cefoTEtan (CEFOTAN) 2 g in sodium chloride 0.9 % 100 mL IVPB        2 g 200 mL/hr over 30 Minutes Intravenous Every 12 hours 10/08/22 1304 10/08/22 2127   10/08/22 0730  cefoTEtan (CEFOTAN) 2 g in sodium chloride 0.9 % 100 mL IVPB        2 g 200 mL/hr over 30 Minutes Intravenous On call to O.R. 10/08/22 1914 10/08/22 0956         Note: Portions of this report may have been transcribed using voice recognition software. Every effort was made to ensure accuracy; however, inadvertent computerized transcription errors may be present.   Any transcriptional errors that result from this process are unintentional.    Ardeth Sportsman, MD, FACS, MASCRS Esophageal, Gastrointestinal & Colorectal Surgery Robotic and Minimally Invasive Surgery  [facility] [address] [place], [place] [address] [phone] Fax [phone] Main  CONTACT INFORMATION:  Weekday (9AM-5PM): Call [facility] main office at [phone]  Weeknight (5PM-9AM) or Weekend/Holiday: Check [email or web address] (password " [email or web address]") for General Surgery [facility] coverage  (Please, do not use SecureChat as it is not reliable communication to reach operating surgeons for immediate patient care given surgeries/outpatient duties/clinic/cross-coverage/off post-call which would lead to a delay in care.  Epic staff messaging available for outptient concerns, but may not be answered for 48 hours or more).     10/10/2022  10:36 AM

## 2022-10-11 LAB — CBC
HCT: 34.4 % — ABNORMAL LOW (ref 39.0–52.0)
Hemoglobin: 10.9 g/dL — ABNORMAL LOW (ref 13.0–17.0)
MCH: 26.2 pg (ref 26.0–34.0)
MCHC: 31.7 g/dL (ref 30.0–36.0)
MCV: 82.7 fL (ref 80.0–100.0)
Platelets: 171 10*3/uL (ref 150–400)
RBC: 4.16 MIL/uL — ABNORMAL LOW (ref 4.22–5.81)
RDW: 18 % — ABNORMAL HIGH (ref 11.5–15.5)
WBC: 7.7 10*3/uL (ref 4.0–10.5)
nRBC: 0 % (ref 0.0–0.2)

## 2022-10-11 LAB — BASIC METABOLIC PANEL
Anion gap: 7 (ref 5–15)
BUN: 10 mg/dL (ref 8–23)
CO2: 25 mmol/L (ref 22–32)
Calcium: 8.6 mg/dL — ABNORMAL LOW (ref 8.9–10.3)
Chloride: 103 mmol/L (ref 98–111)
Creatinine, Ser: 0.81 mg/dL (ref 0.61–1.24)
GFR, Estimated: >60 mL/min (ref >60)
Glucose, Bld: 107 mg/dL — ABNORMAL HIGH (ref 70–99)
Potassium: 3.9 mmol/L (ref 3.5–5.1)
Sodium: 135 mmol/L (ref 135–145)

## 2022-10-11 MED ORDER — TRAMADOL HCL 50 MG PO TABS: 50.0000 mg | ORAL_TABLET | Freq: Four times a day (QID) | ORAL | Status: DC | PRN

## 2022-10-11 MED ORDER — SODIUM CHLORIDE 0.9 % IV SOLN: 250.0000 mL | INTRAVENOUS | Status: DC | PRN

## 2022-10-11 MED ORDER — SODIUM CHLORIDE 0.9% FLUSH: 3.0000 mL | INTRAVENOUS | Status: DC | PRN

## 2022-10-11 MED ORDER — HYDROMORPHONE HCL 1 MG/ML IJ SOLN: 0.5000 mg | INTRAMUSCULAR | Status: DC | PRN

## 2022-10-11 MED ORDER — LACTATED RINGERS IV BOLUS: 1000.0000 mL | Freq: Three times a day (TID) | INTRAVENOUS | Status: DC | PRN

## 2022-10-11 MED ORDER — GABAPENTIN 100 MG PO CAPS
300.0000 mg | ORAL_CAPSULE | Freq: Three times a day (TID) | ORAL | Status: DC
  Administered 2022-10-11 – 2022-10-12 (×5): 300 mg via ORAL
  Filled 2022-10-11 (×5): qty 3

## 2022-10-11 MED ORDER — SODIUM CHLORIDE 0.9% FLUSH
3.0000 mL | Freq: Two times a day (BID) | INTRAVENOUS | Status: DC
  Administered 2022-10-11 – 2022-10-12 (×2): 3 mL via INTRAVENOUS

## 2022-10-11 NOTE — Progress Notes (Signed)
10/11/2022  Charles Myers [id number] 1955-11-06  CARE TEAM: PCP: Farris Has, MD  Outpatient Care Team: Patient Care Team: Farris Has, MD as PCP - General (Family Medicine) Nahser, Deloris Ping, MD as PCP - Cardiology (Cardiology)  Inpatient Treatment Team: Treatment Team: Attending Provider: Romie Levee, MD; Registered Nurse: Vallarie Mare, RN; Technician: Christell Constant, NT; Social Worker: Larrie Kass, LCSW; Utilization Review: Bess Kinds, RN   Problem List:   Principal Problem:   Colostomy in place [facility]) Active Problems:   Hyperlipidemia   GERD (gastroesophageal reflux disease)   Anxiety   History of diverticulitis of colon   Diverticulitis of large intestine with perforation and abscess   History of DVT (deep vein thrombosis)   10/08/2022  POST-OPERATIVE DIAGNOSIS:  DIVERTICULITIS, COLOSTOMY IN PLACE   PROCEDURE:  OPEN COLOSTOMY REVERSAL INCISIONAL HERNIA REPAIR   Surgeon:  Romie Levee, MD  OR FINDINGS: Significant filmy adhesions within the pelvis and abdomen   Assessment [name] Stay = 3 days) 3 Days Post-Op    Ileus resolving.   Plan:  ERAS protocol  Significant lysis of adhesions and repair.  High risk of postop ileus but now having flatus and bowel movements and feeling better.  Try dysphagia 1/full liquid diet.  Possibly advance to soft later today if continues to do well.  Continue alvimopan as needed to minimize postop ileus.  Most likely can hold after today..  Medlock IV fluids with as needed boluses.  GERD = PPI  Hyperlipidemia = atorvastatin, ezetimibe  Pain control.  Remove wick in old colostomy site postop day #2 = 6/9 Sunday  -VTE prophylaxis- SCDs, enoxaparin etc  -mobilize as tolerated to help recovery  -Disposition:  Disposition:  The patient is from: Home  Anticipate discharge to:  Home  Anticipated Date of Discharge is:  June 12,2024    Barriers to discharge:  Consultant clearance & sign  off   and Pending Clinical improvement (more likely than not)  Patient currently is NOT MEDICALLY STABLE for discharge from the hospital from a surgery standpoint.      I reviewed last 24 h vitals and pain scores, last 48 h intake and output, last 24 h labs and trends, and last 24 h imaging results.  I have reviewed this patient's available data, including medical history, events of note, test results, etc as part of my evaluation.   A significant portion of that time was spent in counseling. Care during the described time interval was provided by me.  This care required moderate level of medical decision making.  10/11/2022    Subjective: (Chief complaint)  Patient with some flatus bowel moods and feeling much better.  Hungry.  Walking hallways without any difficulty.  Many questions.  Objective:  Vital signs:  Vitals:   10/10/22 2035 10/10/22 2154 10/11/22 0310 10/11/22 0505  BP: 111/65 119/67  110/71  Pulse: 83 88  84  Resp: 18 17  18   Temp: 98.2 F (36.8 C) 98.2 F (36.8 C)  97.8 F (36.6 C)  TempSrc: Oral   Oral  SpO2: 93% 95%  95%  Weight:   97.6 kg   Height:        Last BM Date : 10/10/22  Intake/Output   Yesterday:  06/08 0701 - 06/09 0700 In: 1604.3 [P.O.:120; I.V.:1484.3] Out: 1341 [Urine:1300; Drains:40; Stool:1] This shift:  Total I/O In: -  Out: 400 [Urine:400]  Bowel function:  Flatus: YES  BM:  YES  Right lower quadrant [place] 19 [place] drain  drain: Serous   Physical Exam:  General: Pt awake/alert in no acute distress.  Calm.  Chatty.  Inquisitive. Eyes: PERRL, normal EOM.  Sclera clear.  No icterus Neuro: CN II-XII intact w/o focal sensory/motor deficits. Lymph: No head/neck/groin lymphadenopathy Psych:  No delerium/psychosis/paranoia.  Oriented x 4 HENT: Normocephalic, Mucus membranes moist.  No thrush Neck: Supple, No tracheal deviation.  No obvious thyromegaly Chest: No pain to chest wall compression.  Good respiratory  excursion.  No audible wheezing CV:  Pulses intact.  Regular rhythm.  No major extremity edema MS: Normal AROM mjr joints.  No obvious deformity  Abdomen: Soft.  Nondistended.  Mildly tender at incisions only.  Colostomy with scant serosanguineous drainage.  Packing removed with 2 x 1 x 2 cm deep wound.  No bleeding.  Replaced with dry dressing.  Staples intact.  Drain without irritation.  Serous output.  No evidence of peritonitis.  No incarcerated hernias.  Ext:   No deformity.  No mjr edema.  No cyanosis Skin: No petechiae / purpurea.  No major sores.  Warm and dry    Results:   Cultures: No results found for this or any previous visit (from the past 720 hour(s)).  Labs: Results for orders placed or performed during the hospital encounter of 10/08/22 (from the past 48 hour(s))  CBC     Status: Abnormal   Collection Time: 10/10/22  5:32 AM  Result Value Ref Range   WBC 8.8 4.0 - 10.5 K/uL   RBC 4.14 (L) 4.22 - 5.81 MIL/uL   Hemoglobin 10.8 (L) 13.0 - 17.0 g/dL   HCT 16.1 (L) 09.6 - 04.5 %   MCV 83.1 80.0 - 100.0 fL   MCH 26.1 26.0 - 34.0 pg   MCHC 31.4 30.0 - 36.0 g/dL   RDW 40.9 (H) 81.1 - 91.4 %   Platelets 156 150 - 400 K/uL   nRBC 0.0 0.0 - 0.2 %    Comment: Performed at [facility], [address] [place], [place] [address]  Basic metabolic panel     Status: Abnormal   Collection Time: 10/10/22  5:32 AM  Result Value Ref Range   Sodium 135 135 - 145 mmol/L   Potassium 3.7 3.5 - 5.1 mmol/L   Chloride 102 98 - 111 mmol/L   CO2 26 22 - 32 mmol/L   Glucose, Bld 106 (H) 70 - 99 mg/dL    Comment: Glucose reference range applies only to samples taken after fasting for at least 8 hours.   BUN 14 8 - 23 mg/dL   Creatinine, Ser 6.21 0.61 - 1.24 mg/dL   Calcium 8.4 (L) 8.9 - 10.3 mg/dL   GFR, Estimated >30 >86 mL/min    Comment: (NOTE) Calculated using the CKD-EPI Creatinine Equation (2021)    Anion gap 7 5 - 15    Comment: Performed at [facility], [address], [place], [place] [address]  CBC     Status: Abnormal   Collection Time: 10/11/22  5:08 AM  Result Value Ref Range   WBC 7.7 4.0 - 10.5 K/uL   RBC 4.16 (L) 4.22 - 5.81 MIL/uL   Hemoglobin 10.9 (L) 13.0 - 17.0 g/dL   HCT 96.2 (L) 95.2 - 84.1 %   MCV 82.7 80.0 - 100.0 fL   MCH 26.2 26.0 - 34.0 pg   MCHC 31.7 30.0 - 36.0 g/dL   RDW 32.4 (H) 40.1 - 02.7 %   Platelets 171 150 - 400  K/uL   nRBC 0.0 0.0 - 0.2 %    Comment: Performed at [facility], [address], [place], [place] [address]  Basic metabolic panel     Status: Abnormal   Collection Time: 10/11/22  5:08 AM  Result Value Ref Range   Sodium 135 135 - 145 mmol/L   Potassium 3.9 3.5 - 5.1 mmol/L   Chloride 103 98 - 111 mmol/L   CO2 25 22 - 32 mmol/L   Glucose, Bld 107 (H) 70 - 99 mg/dL    Comment: Glucose reference range applies only to samples taken after fasting for at least 8 hours.   BUN 10 8 - 23 mg/dL   Creatinine, Ser 4.13 0.61 - 1.24 mg/dL   Calcium 8.6 (L) 8.9 - 10.3 mg/dL   GFR, Estimated >24 >40 mL/min    Comment: (NOTE) Calculated using the CKD-EPI Creatinine Equation (2021)    Anion gap 7 5 - 15    Comment: Performed at [facility], [address] [place], [place] [address]    Imaging / Studies: No results found.  Medications / Allergies: per chart  Antibiotics: Anti-infectives (From admission, onward)    Start     Dose/Rate Route Frequency Ordered Stop   10/08/22 2200  cefoTEtan (CEFOTAN) 2 g in sodium chloride 0.9 % 100 mL IVPB        2 g 200 mL/hr over 30 Minutes Intravenous Every 12 hours 10/08/22 1304 10/08/22 2127   10/08/22 0730  cefoTEtan (CEFOTAN) 2 g in sodium chloride 0.9 % 100 mL IVPB        2 g 200 mL/hr over 30 Minutes Intravenous On call to O.R. 10/08/22 5366 10/08/22 0956         Note: Portions of this report may have been transcribed using voice recognition software. Every effort was made to ensure  accuracy; however, inadvertent computerized transcription errors may be present.   Any transcriptional errors that result from this process are unintentional.    Ardeth Sportsman, MD, FACS, MASCRS Esophageal, Gastrointestinal & Colorectal Surgery Robotic and Minimally Invasive Surgery  [facility] [address] [place], [place] [address] [phone] Fax [phone] Main  CONTACT INFORMATION:  Weekday (9AM-5PM): Call [facility] main office at [phone]  Weeknight (5PM-9AM) or Weekend/Holiday: Check [email or web address] (password " [email or web address]") for General Surgery [facility] coverage  (Please, do not use SecureChat as it is not reliable communication to reach operating surgeons for immediate patient care given surgeries/outpatient duties/clinic/cross-coverage/off post-call which would lead to a delay in care.  Epic staff messaging available for outptient concerns, but may not be answered for 48 hours or more).     10/11/2022  9:39 AM

## 2022-10-11 NOTE — Progress Notes (Signed)
PHARMACIST - PHYSICIAN COMMUNICATION  CONCERNING: Alvimopan   RECOMMENDATION: This patient is receiving alvimopan post-operatively.  Based on criteria approved by the Pharmacy and Therapeutics Committee, the medication will be discontinued.  DESCRIPTION: These criteria include: Patient will receive NO MORE than 15 doses total during current hospitalization If bowel recovery (documented return of bowel sounds and a bowel movement confirmed by RN or patient) occurs before completion of 7 days of therapy, a pharmacist may discontinue alvimopan  6/9 Alvimopan documented not given by RN for [facility]  If you have questions about this conversion, please contact the Pharmacy Department    Pricilla Riffle, PharmD, BCPS Clinical Pharmacist 10/11/2022 2:16 PM

## 2022-10-12 NOTE — Progress Notes (Signed)
4 Days Post-Op open colostomy reversal and VHR Subjective: Pain controlled.  Ambulating.  Having BM's.  Tolerating clears  Objective: Vital signs in last 24 hours: Temp:  [98.3 F (36.8 C)-98.6 F (37 C)] 98.6 F (37 C) (06/10 1329) Pulse Rate:  [90-99] 99 (06/10 1329) Resp:  [14-17] 14 (06/10 1329) BP: (117-120)/(72-77) 117/74 (06/10 1329) SpO2:  [95 %-96 %] 95 % (06/10 1329)   Intake/Output from previous day: 06/09 0701 - 06/10 0700 In: 780 [P.O.:780] Out: 1925 [Urine:1900; Drains:25] Intake/Output this shift: Total I/O In: 240 [P.O.:240] Out: 365 [Urine:350; Drains:15]   General appearance: alert and cooperative GI: soft, non-distended  Incision: no significant drainage  Lab Results:  Recent Labs    10/10/22 0532 10/11/22 0508  WBC 8.8 7.7  HGB 10.8* 10.9*  HCT 34.4* 34.4*  PLT 156 171    BMET Recent Labs    10/10/22 0532 10/11/22 0508  NA 135 135  K 3.7 3.9  CL 102 103  CO2 26 25  GLUCOSE 106* 107*  BUN 14 10  CREATININE 0.80 0.81  CALCIUM 8.4* 8.6*    PT/INR No results for input(s): "LABPROT", "INR" in the last 72 hours. ABG No results for input(s): "PHART", "HCO3" in the last 72 hours.  Invalid input(s): "PCO2", "PO2"  MEDS, Scheduled  atorvastatin  20 mg Oral q1800   enoxaparin (LOVENOX) injection  40 mg Subcutaneous Q24H   ezetimibe  10 mg Oral Daily   gabapentin  300 mg Oral TID   lip balm   Topical BID   pantoprazole  80 mg Oral Daily   polycarbophil  625 mg Oral BID   saccharomyces boulardii  250 mg Oral BID   sodium chloride flush  3 mL Intravenous Q12H    Studies/Results: No results found.  Assessment: s/p Procedure(s): OPEN COLOSTOMY REVERSAL, CLOSURE OF COLOSTOMY Patient Active Problem List   Diagnosis Date Noted   GERD (gastroesophageal reflux disease) 10/10/2022   Anxiety 10/10/2022   History of diverticulitis of colon 10/10/2022   History of DVT (deep vein thrombosis) 10/10/2022   Colostomy in place (HCC)  10/08/2022   Nonhealing nonsurgical wound 07/17/2022   Irritant contact dermatitis associated with fecal stoma 07/17/2022   Colostomy complication (HCC) 07/17/2022   Diverticulitis of large intestine with perforation and abscess 06/08/2022   Coronary artery calcification 11/14/2015   Hyperlipidemia 11/14/2015    Expected post op course  Plan: Advance diet Probable d/c tom am   LOS: 4 days     .Vanita Panda, MD [facility], [place]    10/12/2022 1:46 PM

## 2022-10-13 MED ORDER — HYDROCODONE-ACETAMINOPHEN 5-325 MG PO TABS: 1.0000 | ORAL_TABLET | Freq: Four times a day (QID) | ORAL | 0 refills | Status: DC | PRN

## 2022-10-13 NOTE — Progress Notes (Signed)
Reviewed written discharge instructions with patient. All questions answered. Patient verbalized understanding. Discharged via wheelchair with all belongings... in stable condition. 

## 2022-10-13 NOTE — Discharge Summary (Signed)
Physician Discharge Summary  Patient ID: Charles Myers MRN: [id number] DOB/AGE: [birth year] 67 y.o.  Admit date: 10/08/2022 Discharge date: 10/13/2022  Admission Diagnoses:  Discharge Diagnoses:  Principal Problem:   Colostomy in place [facility]) Active Problems:   Hyperlipidemia   GERD (gastroesophageal reflux disease)   Anxiety   History of diverticulitis of colon   Diverticulitis of large intestine with perforation and abscess   History of DVT (deep vein thrombosis)   Discharged Condition: good  Hospital Course: Patient was admitted to the med surg floor after surgery.  Diet was advanced as tolerated.  Patient began to have bowel function on postop day 3.  By postop day 5, he was tolerating a solid diet and pain was controlled with oral medications.  He was urinating without difficulty and ambulating without assistance.  Patient was felt to be in stable condition for discharge to home.   Consults: None  Significant Diagnostic Studies: labs: cbc, bmet  Treatments: IV hydration, analgesia: Vicodin, and surgery: open colostomy closure  Discharge Exam: Blood pressure 117/80, pulse 83, temperature 98.2 F (36.8 C), temperature source Oral, resp. rate 20, height 6' (1.829 m), weight 97.6 kg, SpO2 94 %. General appearance: alert and cooperative GI: soft, non-distended Incision/Wound: clean, dry, intact  Disposition: Discharge disposition: 01-Home or Self Care        Allergies as of 10/13/2022   No Known Allergies      Medication List     STOP taking these medications    amoxicillin 500 MG capsule Commonly known as: AMOXIL   rosuvastatin 10 MG tablet Commonly known as: CRESTOR       TAKE these medications    atorvastatin 20 MG tablet Commonly known as: LIPITOR Take 20 mg by mouth daily.   ezetimibe 10 MG tablet Commonly known as: ZETIA Take 1 tablet (10 mg total) by mouth daily.   ferrous sulfate 325 (65 FE) MG EC tablet Take 325 mg by mouth daily.    HYDROcodone-acetaminophen 5-325 MG tablet Commonly known as: NORCO/VICODIN Take 1-2 tablets by mouth every 6 (six) hours as needed for moderate pain.   multivitamin with minerals tablet Take 1 tablet by mouth daily.   omeprazole 40 MG capsule Commonly known as: PRILOSEC Take 40 mg by mouth daily.        Follow-up Information     Romie Levee, MD. Schedule an appointment as soon as possible for a visit in 2 week(s).   Specialties: General Surgery, Colon and Rectal Surgery Contact information: [address] [place] [address] [place] [address] [phone]                 Signed: Vanita Panda 10/13/2022, 9:24 AM

## 2022-10-13 NOTE — Plan of Care (Signed)

## 2022-10-13 NOTE — Discharge Instructions (Addendum)
ABDOMINAL SURGERY: POST OP INSTRUCTIONS  DIET: Follow a light bland diet the first 24 hours after arrival home, such as soup, liquids, crackers, etc.  Be sure to include lots of fluids daily.  Avoid fast food or heavy meals as your are more likely to get nauseated.  Do not eat any uncooked fruits or vegetables for the next 2 weeks as your colon heals. Take your usually prescribed home medications unless otherwise directed. PAIN CONTROL: Pain is best controlled by a usual combination of three different methods TOGETHER: Ice/Heat Over the counter pain medication Prescription pain medication Most patients will experience some swelling and bruising around the incisions.  Ice packs or heating pads (30-60 minutes up to 6 times a day) will help. Use ice for the first few days to help decrease swelling and bruising, then switch to heat to help relax tight/sore spots and speed recovery.  Some people prefer to use ice alone, heat alone, alternating between ice & heat.  Experiment to what works for you.  Swelling and bruising can take several weeks to resolve.   It is helpful to take an over-the-counter pain medication regularly for the first few weeks.  Choose one of the following that works best for you: Naproxen (Aleve, etc)  Two 220mg  tabs twice a day Ibuprofen (Advil, etc) Three 200mg  tabs four times a day (every meal & bedtime) Acetaminophen (Tylenol, etc) 500-650mg  four times a day (every meal & bedtime) A  prescription for pain medication (such as oxycodone, hydrocodone, etc) should be given to you upon discharge.  Take your pain medication as prescribed.  If you are having problems/concerns with the prescription medicine (does not control pain, nausea, vomiting, rash, itching, etc), please call us [phone] to see if we need to switch you to a different pain medicine that will work better for you and/or control your side effect better. If you need a refill on your pain medication, please contact  your pharmacy.  They will contact our office to request authorization. Prescriptions will not be filled after 5 pm or on week-ends. Avoid getting constipated.  Between the surgery and the pain medications, it is common to experience some constipation.  Increasing fluid intake and taking a fiber supplement (such as Metamucil, Citrucel, FiberCon, MiraLax, etc) 1-2 times a day regularly will usually help prevent this problem from occurring.  A mild laxative (prune juice, Milk of Magnesia, MiraLax, etc) should be taken according to package directions if there are no bowel movements after 48 hours.   Watch out for diarrhea.  If you have many loose bowel movements, simplify your diet to bland foods & liquids for a few days.  Stop any stool softeners and decrease your fiber supplement.  Switching to mild anti-diarrheal medications (Kayopectate, Pepto Bismol) can help.  If this worsens or does not improve, please call us. Wash / shower every day.  You may shower over the incision / wound.  Avoid baths until the skin is fully healed.  Continue to shower over incision(s) after the dressing is off.   You may leave the midline incision open to air but keep former ostomy incision covered and change dressing daily.  Wash wounds daily with soap and water.  You may replace a dressing/Band-Aid to cover the incision for comfort if you wish.  Staples can be removed on Sun, June 16. ACTIVITIES as tolerated:   You may resume regular (light) daily activities beginning the next day--such as daily self-care, walking, climbing stairs--gradually increasing activities as  tolerated.  If you can walk 30 minutes without difficulty, it is safe to try more intense activity such as jogging, treadmill, bicycling, low-impact aerobics, swimming, etc. Save the most intensive and strenuous activity for last such as sit-ups, heavy lifting, contact sports, etc  Refrain from any heavy lifting or straining until you are off narcotics for pain  control.   DO NOT PUSH THROUGH PAIN.  Let pain be your guide: If it hurts to do something, don't do it.  Pain is your body warning you to avoid that activity for another week until the pain goes down. You may drive when you are no longer taking prescription pain medication, you can comfortably wear a seatbelt, and you can safely maneuver your car and apply brakes. You may have sexual intercourse when it is comfortable.  FOLLOW UP in our office Please call [facility] at [phone] to set up an appointment to see your surgeon in the office for a follow-up appointment approximately 1-2 weeks after your surgery. Make sure that you call for this appointment the day you arrive home to insure a convenient appointment time. 10. IF YOU HAVE DISABILITY OR FAMILY LEAVE FORMS, BRING THEM TO THE OFFICE FOR PROCESSING.  DO NOT GIVE THEM TO YOUR DOCTOR.   WHEN TO CALL us [phone]: Poor pain control Reactions / problems with new medications (rash/itching, nausea, etc)  Fever over 101.5 F (38.5 C) Inability to urinate Nausea and/or vomiting Worsening swelling or bruising Continued bleeding from incision. Increased pain, redness, or drainage from the incision  The clinic staff is available to answer your questions during regular business hours (8:30am-5pm).  Please don't hesitate to call and ask to speak to one of our nurses for clinical concerns.   A surgeon from [facility] is always on call at the hospitals   If you have a medical emergency, go to the nearest emergency room or call 911.    [facility] [address], [place], [place]  [address] ? MAIN: [phone] ? TOLL FREE: [phone] ? FAX [phone] [email or web address]

## 2022-10-13 NOTE — Care Management Important Message (Signed)
Important Message  Patient Details IM Letter mailed due to Discharge. Name: Charles Myers MRN: [id number] Date of Birth: 04/06/1956   Medicare Important Message Given:  Other (see comment)     Caren Macadam 10/13/2022, 3:58 PM

## 2023-02-12 ENCOUNTER — Other Ambulatory Visit: Payer: Self-pay | Admitting: Surgery

## 2023-02-12 DIAGNOSIS — K439 Ventral hernia without obstruction or gangrene: Secondary | ICD-10-CM

## 2023-03-02 ENCOUNTER — Ambulatory Visit
Admission: RE | Admit: 2023-03-02 | Discharge: 2023-03-02 | Disposition: A | Discharge status: Home / Self Care | Payer: Medicare Other | Source: Ambulatory Visit | Attending: Surgery | Admitting: Surgery

## 2023-03-02 DIAGNOSIS — K439 Ventral hernia without obstruction or gangrene: Secondary | ICD-10-CM

## 2023-03-02 MED ORDER — IOPAMIDOL (ISOVUE-300) INJECTION 61%
100.0000 mL | Freq: Once | INTRAVENOUS | Status: AC | PRN
  Administered 2023-03-02: 100 mL via INTRAVENOUS

## 2023-03-15 ENCOUNTER — Ambulatory Visit: Payer: Self-pay | Admitting: General Surgery

## 2023-03-15 NOTE — H&P (Signed)
Chief Complaint: Follow-up (Complex hernia )       History of Present Illness: Charles Myers is a 67 y.o. male who is seen today as an office consultation at the request of Dr. Kateri Plummer for evaluation of Follow-up (Complex hernia ) .     Patient is a 67 year old male, comes in secondary to incisional hernia.   Patient in February this year had a open Hartman's procedure secondary to peripheral diverticulitis.  This was in [place].  Patient subsequently had a ostomy takedown by Dr. Maisie Fus in June of this year.  Subsequent to this patient formed a hernia at the incision site.  This was primarily repaired at the ostomy takedown operation.   Patient recently had a CT scan which I reviewed personally.  CT scan does show a 6 cm incarcerated hernia with small bowel and omentum.  Patient does not appear to have a hernia at the ostomy site.   Patient had no signs or symptoms of incarceration or strangulation.     Review of Systems: A complete review of systems was obtained from the patient.  I have reviewed this information and discussed as appropriate with the patient.  See HPI as well for other ROS.   Review of Systems  Constitutional:  Negative for fever.  HENT:  Negative for congestion.   Eyes:  Negative for blurred vision.  Respiratory:  Negative for cough, shortness of breath and wheezing.   Cardiovascular:  Negative for chest pain and palpitations.  Gastrointestinal:  Negative for heartburn.  Genitourinary:  Negative for dysuria.  Musculoskeletal:  Negative for myalgias.  Skin:  Negative for rash.  Neurological:  Negative for dizziness and headaches.  Psychiatric/Behavioral:  Negative for depression and suicidal ideas.   All other systems reviewed and are negative.       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)   Coronary artery calcification   Diverticulitis of large intestine with perforation and abscess   GERD (gastroesophageal reflux disease)   History of diverticulitis of colon   History of DVT (deep vein thrombosis)   Hyperlipidemia   Irritant contact dermatitis associated with fecal stoma   Nonhealing nonsurgical wound        Past Surgical History       Past Surgical History:  Procedure Laterality Date   COLECTOMY W/ COLOSTOMY SURGERY   06/21/2022   ostomy reversal   10/08/2022   LIPOMA/ARM Left          Allergies  No Known Allergies     Medications Ordered Prior to Encounter        Current Outpatient Medications on File Prior to Visit  Medication Sig Dispense Refill   bisacodyL (DULCOLAX) 5 mg EC tablet Take 4 tablets (20 mg total) by mouth once daily as needed for Constipation for up to 1 dose 4 tablet 0   ELIQUIS 5 mg tablet Take 5 mg by mouth 2 (two) times daily       ezetimibe (ZETIA) 10 mg tablet Take 10 mg by mouth once daily  omeprazole (PRILOSEC OTC) 20 MG EC tablet Take 20 mg by mouth once daily       rosuvastatin (CRESTOR) 10 MG tablet Take 1 tablet by mouth once daily        No current facility-administered medications on file prior to visit.        Family History       Family History  Problem Relation Age of Onset   Colon cancer Father     Breast cancer Sister     Lung cancer Brother          Tobacco Use History  Social History        Tobacco Use  Smoking Status Former   Current packs/day: 0.00   Types: Cigarettes   Quit date: 2010   Years since quitting: 14.8  Smokeless Tobacco Never        Social History  Social History         Socioeconomic History   Marital status: Married  Tobacco Use   Smoking status: Former      Current packs/day: 0.00      Types:  Cigarettes      Quit date: 2010      Years since quitting: 14.8   Smokeless tobacco: Never  Substance and Sexual Activity   Alcohol use: Yes   Drug use: Never    Social Drivers of Metallurgist Insecurity: No Food Insecurity (10/08/2022)    Received from [facility]    Hunger Vital Sign     Worried About Running Out of Food in the Last Year: Never true     Ran Out of Food in the Last Year: Never true  Transportation Needs: No Transportation Needs (10/08/2022)    Received from [facility]     Lack of Transportation (Medical): No     Lack of Transportation (Non-Medical): No        Objective:         Vitals:    03/15/23 1046  Temp: 36.7 C (98.1 F)  Weight: 96.2 kg (212 lb)    Body mass index is 27.97 kg/m.   Physical Exam Constitutional:      General: He is not in acute distress.    Appearance: Normal appearance.  HENT:     Head: Normocephalic.     Nose: No rhinorrhea.     Mouth/Throat:     Mouth: Mucous membranes are moist.     Pharynx: Oropharynx is clear.  Eyes:     General: No scleral icterus.    Pupils: Pupils are equal, round, and reactive to light.  Cardiovascular:     Rate and Rhythm: Normal rate.     Pulses: Normal pulses.  Pulmonary:     Effort: Pulmonary effort is normal. No respiratory distress.     Breath sounds: No stridor. No wheezing.  Abdominal:     General: Abdomen is flat. There is no distension.     Tenderness: There is no abdominal tenderness. There is no guarding or rebound.     Hernia: A hernia is present. Hernia is present in the ventral area.     Musculoskeletal:        General: Normal range of motion.     Cervical back: Normal range of motion and neck supple.  Skin:    General: Skin is warm and dry.     Capillary Refill: Capillary refill takes less than 2 seconds.  Coloration: Skin is not jaundiced.  Neurological:     General: No focal deficit present.     Mental Status: He is alert and  oriented to person, place, and time. Mental status is at baseline.  Psychiatric:        Mood and Affect: Mood normal.        Thought Content: Thought content normal.        Judgment: Judgment normal.        Hernia Size: 6 cm Incarcerated: Yes Recurrent Hernia   Assessment and Plan:  Diagnoses and all orders for this visit:   Incisional hernia without obstruction or gangrene     Charles Myers is a 67 y.o. male    Patient with a large midline incisional hernia.  This will likely require a retrorectus versus possible T AR hernia repair with mesh.  We also discussed lyse of adhesions.    We will proceed to the OR for a open incisional hernia repair with mesh and lysis of adhesions All risks and benefits were discussed with the patient, to generally include infection, bleeding, damage to surrounding structures, acute and chronic nerve pain, and recurrence. Alternatives were offered and described.  All questions were answered and the patient voiced understanding of the procedure and wishes to proceed at this point.             No follow-ups on file.   Axel Filler, MD, [facility], [place] [facility]

## 2023-05-03 NOTE — Progress Notes (Signed)
 Surgical Instructions   Your procedure is scheduled on Thursday, January 9th, 2025. Report to [facility] Main Entrance A at 9:00 A.M., then check in with the Admitting office. Any questions or running late day of surgery: call [phone]  Questions prior to your surgery date: call [phone], Monday-Friday, 8am-4pm. If you experience any cold or flu symptoms such as cough, fever, chills, shortness of breath, etc. between now and your scheduled surgery, please notify us  at the above number.     Remember:  Do not eat after midnight the night before your surgery   You may drink clear liquids until 8:00 the morning of your surgery.   Clear liquids allowed are: Water , Non-Citrus Juices (without pulp), Carbonated Beverages, Clear Tea (no milk, honey, etc.), Black Coffee Only (NO MILK, CREAM OR POWDERED CREAMER of any kind), and Gatorade.  Patient Instructions  The night before surgery:  No food after midnight. ONLY clear liquids after midnight Drink TWO (2) Pre-Surgery Clear Ensure the night before surgery.    The day of surgery (if you do NOT have diabetes):  Drink ONE (1) Pre-Surgery Clear Ensure by 8:00 the morning of surgery. Drink in one sitting. Do not sip.  This drink was given to you during your hospital  pre-op appointment visit.  Nothing else to drink after completing the  Pre-Surgery Clear Ensure.          If you have questions, please contact your surgeon's office.     Take these medicines the morning of surgery with A SIP OF WATER : Atorvastatin  (Lipitor) Ezetimibe  (Zetia ) Omeprazole (Prilosec)   May take these medicines IF NEEDED: None.     One week prior to surgery, STOP taking any Aspirin (unless otherwise instructed by your surgeon) Aleve, Naproxen, Ibuprofen, Motrin, Advil, Goody's, BC's, all herbal medications, fish oil, and non-prescription vitamins.                     Do NOT Smoke (Tobacco/Vaping) for 24 hours prior to your procedure.  If you  use a CPAP at night, you may bring your mask/headgear for your overnight stay.   You will be asked to remove any contacts, glasses, piercing's, hearing aid's, dentures/partials prior to surgery. Please bring cases for these items if needed.    Patients discharged the day of surgery will not be allowed to drive home, and someone needs to stay with them for 24 hours.  SURGICAL WAITING ROOM VISITATION Patients may have no more than 2 support people in the waiting area - these visitors may rotate.   Pre-op nurse will coordinate an appropriate time for 1 ADULT support person, who may not rotate, to accompany patient in pre-op.  Children under the age of 64 must have an adult with them who is not the patient and must remain in the main waiting area with an adult.  If the patient needs to stay at the hospital during part of their recovery, the visitor guidelines for inpatient rooms apply.  Please refer to the [facility] website for the visitor guidelines for any additional information.   If you received a COVID test during your pre-op visit  it is requested that you wear a mask when out in public, stay away from anyone that may not be feeling well and notify your surgeon if you develop symptoms. If you have been in contact with anyone that has tested positive in the last 10 days please notify you surgeon.      Pre-operative CHG Bathing  Instructions   You can play a key role in reducing the risk of infection after surgery. Your skin needs to be as free of germs as possible. You can reduce the number of germs on your skin by washing with CHG (chlorhexidine  gluconate) soap before surgery. CHG is an antiseptic soap that kills germs and continues to kill germs even after washing.   DO NOT use if you have an allergy to chlorhexidine /CHG or antibacterial soaps. If your skin becomes reddened or irritated, stop using the CHG and notify one of our RNs at [phone].              TAKE A SHOWER THE NIGHT  BEFORE SURGERY AND THE DAY OF SURGERY    Please keep in mind the following:  DO NOT shave, including legs and underarms, 48 hours prior to surgery.   You may shave your face before/day of surgery.  Place clean sheets on your bed the night before surgery Use a clean washcloth (not used since being washed) for each shower. DO NOT sleep with pet's night before surgery.  CHG Shower Instructions:  Wash your face and private area with normal soap. If you choose to wash your hair, wash first with your normal shampoo.  After you use shampoo/soap, rinse your hair and body thoroughly to remove shampoo/soap residue.  Turn the water  OFF and apply half the bottle of CHG soap to a CLEAN washcloth.  Apply CHG soap ONLY FROM YOUR NECK DOWN TO YOUR TOES (washing for 3-5 minutes)  DO NOT use CHG soap on face, private areas, open wounds, or sores.  Pay special attention to the area where your surgery is being performed.  If you are having back surgery, having someone wash your back for you may be helpful. Wait 2 minutes after CHG soap is applied, then you may rinse off the CHG soap.  Pat dry with a clean towel  Put on clean pajamas    Additional instructions for the day of surgery: DO NOT APPLY any lotions, deodorants, cologne, or perfumes.   Do not wear jewelry or makeup Do not wear nail polish, gel polish, artificial nails, or any other type of covering on natural nails (fingers and toes) Do not bring valuables to the hospital. [facility] is not responsible for valuables/personal belongings. Put on clean/comfortable clothes.  Please brush your teeth.  Ask your nurse before applying any prescription medications to the skin.

## 2023-05-04 ENCOUNTER — Encounter (HOSPITAL_COMMUNITY)
Admission: RE | Admit: 2023-05-04 | Discharge: 2023-05-04 | Disposition: A | Discharge status: Home / Self Care | Payer: Medicare Other | Source: Ambulatory Visit | Attending: General Surgery | Admitting: General Surgery

## 2023-05-04 ENCOUNTER — Encounter (HOSPITAL_COMMUNITY): Payer: Self-pay

## 2023-05-04 ENCOUNTER — Other Ambulatory Visit: Payer: Self-pay

## 2023-05-04 VITALS — BP 122/79 | HR 63 | Temp 97.5°F | Resp 18 | Ht 73.0 in | Wt 214.0 lb

## 2023-05-04 DIAGNOSIS — Z01818 Encounter for other preprocedural examination: Secondary | ICD-10-CM | POA: Diagnosis present

## 2023-05-04 DIAGNOSIS — Z01812 Encounter for preprocedural laboratory examination: Secondary | ICD-10-CM | POA: Diagnosis not present

## 2023-05-04 HISTORY — DX: Anemia, unspecified: D64.9

## 2023-05-04 HISTORY — DX: Chronic obstructive pulmonary disease, unspecified: J44.9

## 2023-05-04 LAB — CBC
HCT: 46 % (ref 39.0–52.0)
Hemoglobin: 15.5 g/dL (ref 13.0–17.0)
MCH: 29.8 pg (ref 26.0–34.0)
MCHC: 33.7 g/dL (ref 30.0–36.0)
MCV: 88.3 fL (ref 80.0–100.0)
Platelets: 214 10*3/uL (ref 150–400)
RBC: 5.21 MIL/uL (ref 4.22–5.81)
RDW: 13.6 % (ref 11.5–15.5)
WBC: 6.4 10*3/uL (ref 4.0–10.5)
nRBC: 0 % (ref 0.0–0.2)

## 2023-05-04 LAB — BASIC METABOLIC PANEL
Anion gap: 6 (ref 5–15)
BUN: 11 mg/dL (ref 8–23)
CO2: 27 mmol/L (ref 22–32)
Calcium: 9.7 mg/dL (ref 8.9–10.3)
Chloride: 106 mmol/L (ref 98–111)
Creatinine, Ser: 0.99 mg/dL (ref 0.61–1.24)
GFR, Estimated: >60 mL/min (ref >60)
Glucose, Bld: 110 mg/dL — ABNORMAL HIGH (ref 70–99)
Potassium: 3.9 mmol/L (ref 3.5–5.1)
Sodium: 139 mmol/L (ref 135–145)

## 2023-05-04 NOTE — Progress Notes (Signed)
 PCP - Dr. [employer] Cardiologist - Dr. Aleene Finely - does not see on a regular basis  PPM/ICD - denies Device Orders - n/a Rep Notified - n/a  Chest x-ray - denies EKG - 08/28/21 Stress Test - denies ECHO - denies Cardiac Cath - denies  Sleep Study - denies CPAP - n/a  No DM.  Last dose of GLP1 agonist-  n/a GLP1 instructions: n/a  Blood Thinner Instructions: n/a Aspirin Instructions: n/a  ERAS Protcol - clears until 0800 PRE-SURGERY Ensure - per order, patient will drink 2 Ensure the night before and 1 the morning of  COVID TEST- n/a   Anesthesia review: no  Patient denies shortness of breath, fever, cough and chest pain at PAT appointment   All instructions explained to the patient, with a verbal understanding of the material. Patient agrees to go over the instructions while at home for a better understanding. Patient also instructed to self quarantine after being tested for COVID-19. The opportunity to ask questions was provided.

## 2023-05-09 ENCOUNTER — Other Ambulatory Visit: Payer: Self-pay | Admitting: Cardiovascular Disease

## 2023-05-13 ENCOUNTER — Other Ambulatory Visit: Payer: Self-pay

## 2023-05-13 ENCOUNTER — Inpatient Hospital Stay (HOSPITAL_COMMUNITY): Payer: Medicare Other | Admitting: Anesthesiology

## 2023-05-13 ENCOUNTER — Encounter (HOSPITAL_COMMUNITY): Payer: Self-pay | Admitting: General Surgery

## 2023-05-13 ENCOUNTER — Encounter (HOSPITAL_COMMUNITY)
Admission: RE | Disposition: A | Discharge status: Home / Self Care | Payer: Self-pay | Source: Home / Self Care | Attending: General Surgery

## 2023-05-13 ENCOUNTER — Inpatient Hospital Stay (HOSPITAL_COMMUNITY)
Admission: RE | Admit: 2023-05-13 | Discharge: 2023-05-16 | DRG: 337 | Disposition: A | Discharge status: Home / Self Care | Payer: Medicare Other | Attending: General Surgery | Admitting: General Surgery

## 2023-05-13 DIAGNOSIS — Z9049 Acquired absence of other specified parts of digestive tract: Secondary | ICD-10-CM | POA: Diagnosis not present

## 2023-05-13 DIAGNOSIS — Z800 Family history of malignant neoplasm of digestive organs: Secondary | ICD-10-CM | POA: Diagnosis not present

## 2023-05-13 DIAGNOSIS — Z7901 Long term (current) use of anticoagulants: Secondary | ICD-10-CM | POA: Diagnosis not present

## 2023-05-13 DIAGNOSIS — K432 Incisional hernia without obstruction or gangrene: Secondary | ICD-10-CM | POA: Diagnosis present

## 2023-05-13 DIAGNOSIS — Z801 Family history of malignant neoplasm of trachea, bronchus and lung: Secondary | ICD-10-CM | POA: Diagnosis not present

## 2023-05-13 DIAGNOSIS — K219 Gastro-esophageal reflux disease without esophagitis: Secondary | ICD-10-CM | POA: Diagnosis present

## 2023-05-13 DIAGNOSIS — K660 Peritoneal adhesions (postprocedural) (postinfection): Secondary | ICD-10-CM | POA: Diagnosis present

## 2023-05-13 DIAGNOSIS — Z8719 Personal history of other diseases of the digestive system: Principal | ICD-10-CM

## 2023-05-13 DIAGNOSIS — K430 Incisional hernia with obstruction, without gangrene: Secondary | ICD-10-CM | POA: Diagnosis present

## 2023-05-13 DIAGNOSIS — Z86718 Personal history of other venous thrombosis and embolism: Secondary | ICD-10-CM

## 2023-05-13 DIAGNOSIS — Z803 Family history of malignant neoplasm of breast: Secondary | ICD-10-CM

## 2023-05-13 DIAGNOSIS — Z87891 Personal history of nicotine dependence: Secondary | ICD-10-CM | POA: Diagnosis not present

## 2023-05-13 DIAGNOSIS — Z79899 Other long term (current) drug therapy: Secondary | ICD-10-CM

## 2023-05-13 DIAGNOSIS — I2510 Atherosclerotic heart disease of native coronary artery without angina pectoris: Secondary | ICD-10-CM | POA: Diagnosis present

## 2023-05-13 DIAGNOSIS — E785 Hyperlipidemia, unspecified: Secondary | ICD-10-CM | POA: Diagnosis present

## 2023-05-13 HISTORY — PX: INCISIONAL HERNIA REPAIR: SHX193

## 2023-05-13 SURGERY — REPAIR, HERNIA, INCISIONAL
Anesthesia: General | Site: Abdomen

## 2023-05-13 MED ORDER — SUGAMMADEX SODIUM 200 MG/2ML IV SOLN
INTRAVENOUS | Status: DC | PRN
  Administered 2023-05-13: 200 mg via INTRAVENOUS

## 2023-05-13 MED ORDER — KETOROLAC TROMETHAMINE 30 MG/ML IJ SOLN
INTRAMUSCULAR | Status: AC
  Filled 2023-05-13: qty 1

## 2023-05-13 MED ORDER — CEFAZOLIN SODIUM-DEXTROSE 2-4 GM/100ML-% IV SOLN
2.0000 g | INTRAVENOUS | Status: AC
  Administered 2023-05-13: 2 g via INTRAVENOUS
  Filled 2023-05-13: qty 100

## 2023-05-13 MED ORDER — ORAL CARE MOUTH RINSE: 15.0000 mL | Freq: Once | OROMUCOSAL | Status: AC

## 2023-05-13 MED ORDER — LIDOCAINE 2% (20 MG/ML) 5 ML SYRINGE
INTRAMUSCULAR | Status: DC | PRN
  Administered 2023-05-13: 60 mg via INTRAVENOUS

## 2023-05-13 MED ORDER — ORAL CARE MOUTH RINSE: 15.0000 mL | OROMUCOSAL | Status: DC | PRN

## 2023-05-13 MED ORDER — LIDOCAINE 2% (20 MG/ML) 5 ML SYRINGE
INTRAMUSCULAR | Status: AC
  Filled 2023-05-13: qty 5

## 2023-05-13 MED ORDER — CHLORHEXIDINE GLUCONATE CLOTH 2 % EX PADS: 6.0000 | MEDICATED_PAD | Freq: Once | CUTANEOUS | Status: DC

## 2023-05-13 MED ORDER — ENSURE PRE-SURGERY PO LIQD: 296.0000 mL | Freq: Once | ORAL | Status: DC

## 2023-05-13 MED ORDER — PHENYLEPHRINE 80 MCG/ML (10ML) SYRINGE FOR IV PUSH (FOR BLOOD PRESSURE SUPPORT)
PREFILLED_SYRINGE | INTRAVENOUS | Status: DC | PRN
  Administered 2023-05-13 (×2): 80 ug via INTRAVENOUS

## 2023-05-13 MED ORDER — SODIUM CHLORIDE 0.9 % IV SOLN: INTRAVENOUS | Status: DC | PRN

## 2023-05-13 MED ORDER — ONDANSETRON HCL 4 MG/2ML IJ SOLN
INTRAMUSCULAR | Status: AC
  Filled 2023-05-13: qty 2

## 2023-05-13 MED ORDER — DEXAMETHASONE SODIUM PHOSPHATE 10 MG/ML IJ SOLN
INTRAMUSCULAR | Status: AC
  Filled 2023-05-13: qty 1

## 2023-05-13 MED ORDER — ONDANSETRON HCL 4 MG/2ML IJ SOLN: 4.0000 mg | Freq: Four times a day (QID) | INTRAMUSCULAR | Status: DC | PRN

## 2023-05-13 MED ORDER — OXYCODONE HCL 5 MG PO TABS
ORAL_TABLET | ORAL | Status: AC
  Administered 2023-05-14: 5 mg via ORAL
  Filled 2023-05-13: qty 2

## 2023-05-13 MED ORDER — CHLORHEXIDINE GLUCONATE 0.12 % MT SOLN
15.0000 mL | Freq: Once | OROMUCOSAL | Status: AC
  Administered 2023-05-13: 15 mL via OROMUCOSAL
  Filled 2023-05-13: qty 15

## 2023-05-13 MED ORDER — HYDROMORPHONE HCL 1 MG/ML IJ SOLN
1.0000 mg | INTRAMUSCULAR | Status: DC | PRN
  Administered 2023-05-13 – 2023-05-14 (×5): 1 mg via INTRAVENOUS
  Filled 2023-05-13 (×5): qty 1

## 2023-05-13 MED ORDER — LACTATED RINGERS IV SOLN: INTRAVENOUS | Status: DC

## 2023-05-13 MED ORDER — ENSURE PRE-SURGERY PO LIQD
296.0000 mL | Freq: Once | ORAL | Status: DC
Start: 2023-05-13 — End: 2023-05-13

## 2023-05-13 MED ORDER — PROPOFOL 10 MG/ML IV BOLUS
INTRAVENOUS | Status: AC
  Filled 2023-05-13: qty 20

## 2023-05-13 MED ORDER — HYDROMORPHONE HCL 1 MG/ML IJ SOLN
INTRAMUSCULAR | Status: AC
  Filled 2023-05-13: qty 0.5

## 2023-05-13 MED ORDER — FENTANYL CITRATE (PF) 100 MCG/2ML IJ SOLN
25.0000 ug | INTRAMUSCULAR | Status: DC | PRN
  Administered 2023-05-13: 25 ug via INTRAVENOUS
  Administered 2023-05-13: 50 ug via INTRAVENOUS
  Administered 2023-05-13: 25 ug via INTRAVENOUS
  Administered 2023-05-13: 50 ug via INTRAVENOUS

## 2023-05-13 MED ORDER — ROCURONIUM BROMIDE 10 MG/ML (PF) SYRINGE
PREFILLED_SYRINGE | INTRAVENOUS | Status: AC
  Filled 2023-05-13: qty 10

## 2023-05-13 MED ORDER — 0.9 % SODIUM CHLORIDE (POUR BTL) OPTIME
TOPICAL | Status: DC | PRN
  Administered 2023-05-13 (×2): 1000 mL

## 2023-05-13 MED ORDER — MIDAZOLAM HCL 2 MG/2ML IJ SOLN
INTRAMUSCULAR | Status: AC
Start: 2023-05-13 — End: ?
  Filled 2023-05-13: qty 2

## 2023-05-13 MED ORDER — FENTANYL CITRATE (PF) 100 MCG/2ML IJ SOLN
INTRAMUSCULAR | Status: AC
  Filled 2023-05-13: qty 2

## 2023-05-13 MED ORDER — FENTANYL CITRATE (PF) 250 MCG/5ML IJ SOLN
INTRAMUSCULAR | Status: DC | PRN
  Administered 2023-05-13 (×3): 50 ug via INTRAVENOUS
  Administered 2023-05-13: 100 ug via INTRAVENOUS

## 2023-05-13 MED ORDER — OXYCODONE HCL 5 MG/5ML PO SOLN: 5.0000 mg | Freq: Once | ORAL | Status: DC | PRN

## 2023-05-13 MED ORDER — MIDAZOLAM HCL 2 MG/2ML IJ SOLN
INTRAMUSCULAR | Status: DC | PRN
  Administered 2023-05-13: 2 mg via INTRAVENOUS

## 2023-05-13 MED ORDER — DEXAMETHASONE SODIUM PHOSPHATE 10 MG/ML IJ SOLN
INTRAMUSCULAR | Status: DC | PRN
  Administered 2023-05-13: 10 mg via INTRAVENOUS

## 2023-05-13 MED ORDER — ENSURE PRE-SURGERY PO LIQD: 592.0000 mL | Freq: Once | ORAL | Status: DC

## 2023-05-13 MED ORDER — OXYCODONE HCL 5 MG PO TABS
5.0000 mg | ORAL_TABLET | ORAL | Status: DC | PRN
Start: 2023-05-13 — End: 2023-05-14
  Administered 2023-05-13 – 2023-05-14 (×2): 10 mg via ORAL
  Filled 2023-05-13: qty 2
  Filled 2023-05-13: qty 1

## 2023-05-13 MED ORDER — FENTANYL CITRATE (PF) 250 MCG/5ML IJ SOLN
INTRAMUSCULAR | Status: AC
  Filled 2023-05-13: qty 5

## 2023-05-13 MED ORDER — PANTOPRAZOLE SODIUM 40 MG PO TBEC
40.0000 mg | DELAYED_RELEASE_TABLET | Freq: Every day | ORAL | Status: DC
  Administered 2023-05-14 – 2023-05-16 (×3): 40 mg via ORAL
  Filled 2023-05-13 (×3): qty 1

## 2023-05-13 MED ORDER — SODIUM CHLORIDE (PF) 0.9 % IJ SOLN
INTRAMUSCULAR | Status: DC | PRN
  Administered 2023-05-13: 40 mL via INTRAMUSCULAR

## 2023-05-13 MED ORDER — ACETAMINOPHEN 500 MG PO TABS
1000.0000 mg | ORAL_TABLET | ORAL | Status: AC
  Administered 2023-05-13: 1000 mg via ORAL
  Filled 2023-05-13: qty 2

## 2023-05-13 MED ORDER — OXYCODONE HCL 5 MG PO TABS: 5.0000 mg | ORAL_TABLET | Freq: Once | ORAL | Status: DC | PRN

## 2023-05-13 MED ORDER — ONDANSETRON 4 MG PO TBDP: 4.0000 mg | ORAL_TABLET | Freq: Four times a day (QID) | ORAL | Status: DC | PRN

## 2023-05-13 MED ORDER — ROCURONIUM BROMIDE 10 MG/ML (PF) SYRINGE
PREFILLED_SYRINGE | INTRAVENOUS | Status: DC | PRN
  Administered 2023-05-13: 20 mg via INTRAVENOUS
  Administered 2023-05-13: 50 mg via INTRAVENOUS
  Administered 2023-05-13: 20 mg via INTRAVENOUS

## 2023-05-13 MED ORDER — DEXTROSE-SODIUM CHLORIDE 5-0.9 % IV SOLN: INTRAVENOUS | Status: AC

## 2023-05-13 MED ORDER — ONDANSETRON HCL 4 MG/2ML IJ SOLN
INTRAMUSCULAR | Status: DC | PRN
  Administered 2023-05-13: 4 mg via INTRAVENOUS

## 2023-05-13 MED ORDER — PHENYLEPHRINE HCL-NACL 20-0.9 MG/250ML-% IV SOLN
INTRAVENOUS | Status: DC | PRN
  Administered 2023-05-13: 20 ug/min via INTRAVENOUS

## 2023-05-13 MED ORDER — STERILE WATER FOR IRRIGATION IR SOLN
Status: DC | PRN
  Administered 2023-05-13: 1000 mL

## 2023-05-13 MED ORDER — PROPOFOL 10 MG/ML IV BOLUS
INTRAVENOUS | Status: DC | PRN
  Administered 2023-05-13: 150 mg via INTRAVENOUS

## 2023-05-13 MED ORDER — BUPIVACAINE LIPOSOME 1.3 % IJ SUSP
INTRAMUSCULAR | Status: AC
  Filled 2023-05-13: qty 20

## 2023-05-13 MED ORDER — EPHEDRINE 5 MG/ML INJ
INTRAVENOUS | Status: AC
  Filled 2023-05-13: qty 5

## 2023-05-13 MED ORDER — EPHEDRINE SULFATE-NACL 50-0.9 MG/10ML-% IV SOSY
PREFILLED_SYRINGE | INTRAVENOUS | Status: DC | PRN
  Administered 2023-05-13: 5 mg via INTRAVENOUS

## 2023-05-13 MED ORDER — VISTASEAL 10 ML SINGLE DOSE KIT
PACK | CUTANEOUS | Status: DC | PRN
  Administered 2023-05-13: 10 mL via TOPICAL

## 2023-05-13 MED ORDER — TRAMADOL HCL 50 MG PO TABS: 50.0000 mg | ORAL_TABLET | Freq: Four times a day (QID) | ORAL | Status: DC | PRN

## 2023-05-13 MED ORDER — HYDROMORPHONE HCL 1 MG/ML IJ SOLN
INTRAMUSCULAR | Status: DC | PRN
  Administered 2023-05-13: .5 mg via INTRAVENOUS

## 2023-05-13 SURGICAL SUPPLY — 45 items
BAG COUNTER SPONGE SURGICOUNT (BAG) ×1 IMPLANT
BINDER ABDOMINAL 12 ML 46-62 (SOFTGOODS) IMPLANT
BLADE CLIPPER SURG (BLADE) IMPLANT
BLADE SURG 11 STRL SS (BLADE) ×1 IMPLANT
COVER SURGICAL LIGHT HANDLE (MISCELLANEOUS) ×1 IMPLANT
DERMABOND ADVANCED .7 DNX12 (GAUZE/BANDAGES/DRESSINGS) IMPLANT
DEVICE TROCAR PUNCTURE CLOSURE (ENDOMECHANICALS) IMPLANT
DRAIN CHANNEL 19F RND (DRAIN) IMPLANT
DRAPE INCISE IOBAN 66X45 STRL (DRAPES) IMPLANT
DRAPE LAPAROSCOPIC ABDOMINAL (DRAPES) ×1 IMPLANT
DRSG OPSITE POSTOP 4X8 (GAUZE/BANDAGES/DRESSINGS) ×1 IMPLANT
ELECT REM PT RETURN 9FT ADLT (ELECTROSURGICAL) ×1 IMPLANT
ELECTRODE REM PT RTRN 9FT ADLT (ELECTROSURGICAL) ×1 IMPLANT
EVACUATOR SILICONE 100CC (DRAIN) IMPLANT
GLOVE BIO SURGEON STRL SZ7.5 (GLOVE) ×2 IMPLANT
GLOVE BIOGEL PI IND STRL 8 (GLOVE) ×1 IMPLANT
GOWN STRL REUS W/ TWL LRG LVL3 (GOWN DISPOSABLE) ×1 IMPLANT
GOWN STRL REUS W/ TWL XL LVL3 (GOWN DISPOSABLE) ×1 IMPLANT
KIT BASIN OR (CUSTOM PROCEDURE TRAY) ×1 IMPLANT
KIT TURNOVER KIT B (KITS) ×1 IMPLANT
MARKER SKIN DUAL TIP RULER LAB (MISCELLANEOUS) ×1 IMPLANT
MESH SOFT 12X12IN BARD (Mesh General) IMPLANT
NDL HYPO 22X1.5 SAFETY MO (MISCELLANEOUS) IMPLANT
NEEDLE HYPO 22X1.5 SAFETY MO (MISCELLANEOUS) IMPLANT
NS IRRIG 1000ML POUR BTL (IV SOLUTION) ×1 IMPLANT
PACK GENERAL/GYN (CUSTOM PROCEDURE TRAY) ×1 IMPLANT
PAD ARMBOARD 7.5X6 YLW CONV (MISCELLANEOUS) ×2 IMPLANT
PENCIL SMOKE EVACUATOR (MISCELLANEOUS) ×1 IMPLANT
RETAINER VISCERA MED (MISCELLANEOUS) IMPLANT
STAPLER VISISTAT 35W (STAPLE) IMPLANT
SUT ETHILON 3 0 FSL (SUTURE) IMPLANT
SUT MNCRL AB 4-0 PS2 18 (SUTURE) IMPLANT
SUT NOVA NAB GS-21 0 18 T12 DT (SUTURE) IMPLANT
SUT PDS AB 0 CT 36 (SUTURE) IMPLANT
SUT PDS AB 1 TP1 54 (SUTURE) IMPLANT
SUT SILK 3 0 SH 30 (SUTURE) IMPLANT
SUT SILK 3 0SH CR/8 30 (SUTURE) IMPLANT
SUT VIC AB 2-0 SH 27XBRD (SUTURE) IMPLANT
SUT VIC AB 3-0 SH 8-18 (SUTURE) IMPLANT
SUT VICRYL AB 2 0 TIES (SUTURE) ×1 IMPLANT
SYR CONTROL 10ML LL (SYRINGE) IMPLANT
TOWEL GREEN STERILE (TOWEL DISPOSABLE) ×1 IMPLANT
TOWEL GREEN STERILE FF (TOWEL DISPOSABLE) ×1 IMPLANT
TRAY FOLEY W/BAG SLVR 16FR ST (SET/KITS/TRAYS/PACK) IMPLANT
WATER STERILE IRR 1000ML POUR (IV SOLUTION) ×1 IMPLANT

## 2023-05-13 NOTE — Discharge Instructions (Signed)

## 2023-05-13 NOTE — Interval H&P Note (Signed)
 History and Physical Interval Note:  05/13/2023 9:49 AM  Charles Myers  has presented today for surgery, with the diagnosis of INCISIONAL HERNIA 6CM INCARCERATED.  The various methods of treatment have been discussed with the patient and family. After consideration of risks, benefits and other options for treatment, the patient has consented to  Procedure(s): OPEN HERNIA REPAIR INCISIONAL WITH MESH AND LOA (N/A) as a surgical intervention.  The patient's history has been reviewed, patient examined, no change in status, stable for surgery.  I have reviewed the patient's chart and labs.  Questions were answered to the patient's satisfaction.     Douglas Rooks

## 2023-05-13 NOTE — Anesthesia Preprocedure Evaluation (Signed)
 Anesthesia Evaluation  Patient identified by MRN, date of birth, ID band Patient awake    Reviewed: Allergy & Precautions, H&P , NPO status , Patient's Chart, lab work & pertinent test results  Airway Mallampati: II   Neck ROM: full    Dental   Pulmonary COPD, former smoker   breath sounds clear to auscultation       Cardiovascular negative cardio ROS  Rhythm:regular Rate:Normal     Neuro/Psych   Anxiety        GI/Hepatic ,GERD  ,,  Endo/Other    Renal/GU      Musculoskeletal   Abdominal   Peds  Hematology   Anesthesia Other Findings   Reproductive/Obstetrics                             Anesthesia Physical Anesthesia Plan  ASA: 2  Anesthesia Plan: General   Post-op Pain Management:    Induction: Intravenous  PONV Risk Score and Plan: 2 and Ondansetron , Dexamethasone , Midazolam  and Treatment may vary due to age or medical condition  Airway Management Planned: Oral ETT  Additional Equipment:   Intra-op Plan:   Post-operative Plan: Extubation in OR  Informed Consent: I have reviewed the patients History and Physical, chart, labs and discussed the procedure including the risks, benefits and alternatives for the proposed anesthesia with the patient or authorized representative who has indicated his/her understanding and acceptance.     Dental advisory given  Plan Discussed with: CRNA, Anesthesiologist and Surgeon  Anesthesia Plan Comments:        Anesthesia Quick Evaluation

## 2023-05-13 NOTE — Plan of Care (Signed)
 Pt admitted to 6N31 from the PACU via bed, wife at bedside. Patient is alert, oriented x4. Pain score is a 5 at present. IS at bedside and pt understands how to use it, Abd binder on. Mid abd incision intact with dermabond and 6 lap sites noted. SCDs on and operating.

## 2023-05-13 NOTE — Progress Notes (Signed)
 Dilaudid 1 mg IV brought pain down to a 3. Pt performed IS up to 1500. Pt was able to void 150cc urine.

## 2023-05-13 NOTE — Op Note (Signed)
 05/13/2023  12:44 PM  PATIENT:  Charles Myers  68 y.o. male  PRE-OPERATIVE DIAGNOSIS:  INCISIONAL HERNIA 6x15CM INCARCERATED  POST-OPERATIVE DIAGNOSIS:  INCISIONAL HERNIA 6x15CM INCARCERATED  PROCEDURE:  Procedure(s): OPEN HERNIA REPAIR INCISIONAL WITH MESH, RETRORECTUS WITH MUSCULOCUTANEOUS ADVANCEMENT FLAPS BILATERALLY AND LOA x 45 minutes (N/A)  SURGEON:  Surgeons and Role:    DEWAINE Rubin Calamity, MD - Primary  ASSISTANTS: Waddell Collier, RNFA   ANESTHESIA:   local and general  EBL:  50 mL   BLOOD ADMINISTERED:none  DRAINS: none   LOCAL MEDICATIONS USED:  OTHER exaprel  SPECIMEN:  No Specimen  DISPOSITION OF SPECIMEN:  N/A  COUNTS:  YES  TOURNIQUET:  * No tourniquets in log *  DICTATION: .Dragon Dictation  Findings: Patient with a 6 x 15 cm incisional hernia.  Digit span the length of the previous incision site.  Patient did have some adhesions.  These were taken down sharply.  A piece of 23 x 12 cm piece of Bard soft mesh was placed in the retrorectus space.  This covered both the midline hernia as well as weakness over the previous ostomy site.  Details of the procedure:  After the patient was consented he was taken back to the operating room and placed in the supine position with bilateral SCDs in place. The patient was prepped and draped in usual sterile fashion. Antibiotics were confirmed and timeout was called and all facts verified.  At this time I proceeded to excise the skin scar. This was discarded. I proceeded to use electrocautery to maintain hemostasis and dissection took place in the most superior portion of the wound down to the subcutaneous tissues fat to the anterior fascia. This was incised. The fascia was elevated and 2 Kocher clamps.  The hernia sac and the abdominal cavity were entered bluntly. There appeared to be some omentum within the midline in the superior portion of the wound. This was carefully dissected away from the abdominal wall. The  omentum lay in the midline, this was taken down with blunt and sharp dissection circumferentially to the incision.  To the area of the left of the midline.  With the previous ostomy site was there was some small bowel adhesions to the anterior abdominal wall.  These were taken down sharply with Metzenbaums.  This extended laterally past the ostomy site.  This was all taken down sharply.  This was approximate 4 to 5 minutes of adhesiolysis.  The fascia was incised distal to the hernia approximately 4 cm.  The hernia was seen to be approximately 6 cm in from a cephalad to caudad direction it appeared to be approximately 15cm.    At this time I proceeded to retract is rectus muscles medially.  At this time the posterior fascia was then incised.  Using blunt dissection the belly was dissected away from the posterior rectus fascia.  This was done inferiorly and superiorly.  At the midline inferior and superior portions of the linea alba this was taken down from the anterior abdominal wall.  This was done bilaterally.  The left side of the retrorectus fascia was repaired at the previous ostomy site.  This was done using a running 0 Vicryl x 1.  The area was checked for hemostasis.  At this time the posterior rectus fascia was reapproximated using #1 PDS in a standard running fashion x2.  I proceeded to irrigate out the retrorectus space.  The area was measured and seemed to be approximately 23 x 12 cm  in size.  A piece of Bard soft was selected and placed into the retrorectus space.  This fit well and flat.  0 Novofils were used x 3 bilaterally to help as transfascial sutures.  This allowed the mesh to lay tolerated.  Vistaseal  glue was then used to fasten the mesh to the midline fascia.  At this time the anterior fascia and midline were reapproximated using #1 PDS in a standard running fashion x2.  The subcutaneous tissue was irrigated out with sterile saline.  The subcutaneous tissue was then brought together  using interrupted 3-0 Vicryl's.  The skin was then reapproximated using 4-0 Monocryl in subcuticular fashion.  The skin was dressed with Dermabond as were the bilateral incision sites.  The patient tolerated procedure well was taken to the recovery room in stable condition.    PLAN OF CARE: Admit for overnight observation  PATIENT DISPOSITION:  PACU - hemodynamically stable.   Delay start of Pharmacological VTE agent (>24hrs) due to surgical blood loss or risk of bleeding: yes

## 2023-05-13 NOTE — Anesthesia Procedure Notes (Signed)
 Procedure Name: Intubation Date/Time: 05/13/2023 10:56 AM  Performed by: Nicholaus Ethelene SAILOR, CRNAPre-anesthesia Checklist: Patient identified, Emergency Drugs available, Suction available and Patient being monitored Patient Re-evaluated:Patient Re-evaluated prior to induction Oxygen Delivery Method: Circle System Utilized Preoxygenation: Pre-oxygenation with 100% oxygen Induction Type: IV induction Ventilation: Mask ventilation without difficulty Laryngoscope Size: Mac and 4 Grade View: Grade II Tube type: Oral Tube size: 7.5 mm Number of attempts: 1 Airway Equipment and Method: Stylet and Oral airway Placement Confirmation: ETT inserted through vocal cords under direct vision, positive ETCO2 and breath sounds checked- equal and bilateral Secured at: 22 cm Tube secured with: Tape Dental Injury: Teeth and Oropharynx as per pre-operative assessment

## 2023-05-13 NOTE — Anesthesia Postprocedure Evaluation (Signed)
 Anesthesia Post Note  Patient: Charles Myers  Procedure(s) Performed: OPEN HERNIA REPAIR INCISIONAL WITH MESH AND LOA (Abdomen)     Patient location during evaluation: PACU Anesthesia Type: General Level of consciousness: awake and alert Pain management: pain level controlled Vital Signs Assessment: post-procedure vital signs reviewed and stable Respiratory status: spontaneous breathing, nonlabored ventilation, respiratory function stable and patient connected to nasal cannula oxygen Cardiovascular status: blood pressure returned to baseline and stable Postop Assessment: no apparent nausea or vomiting Anesthetic complications: no   No notable events documented.  Last Vitals:  Vitals:   05/13/23 1625 05/13/23 1710  BP:  (!) 117/92  Pulse: (!) 111 98  Resp: (!) 22 20  Temp: 36.6 C 37.4 C  SpO2: 96% 94%    Last Pain:  Vitals:   05/13/23 1732  TempSrc:   PainSc: 7                  Garnette FORBES Skillern

## 2023-05-13 NOTE — H&P (Signed)
 Chief Complaint: Follow-up (Complex hernia )       History of Present Illness: Charles Myers is a 68 y.o. male who is seen today as an office consultation at the request of Dr. Kateri Plummer for evaluation of Follow-up (Complex hernia ) .     Patient is a 68 year old male, comes in secondary to incisional hernia.   Patient in February this year had a open Hartman's procedure secondary to peripheral diverticulitis.  This was in [place].  Patient subsequently had a ostomy takedown by Dr. Maisie Fus in June of this year.  Subsequent to this patient formed a hernia at the incision site.  This was primarily repaired at the ostomy takedown operation.   Patient recently had a CT scan which I reviewed personally.  CT scan does show a 6 cm incarcerated hernia with small bowel and omentum.  Patient does not appear to have a hernia at the ostomy site.   Patient had no signs or symptoms of incarceration or strangulation.     Review of Systems: A complete review of systems was obtained from the patient.  I have reviewed this information and discussed as appropriate with the patient.  See HPI as well for other ROS.   Review of Systems  Constitutional:  Negative for fever.  HENT:  Negative for congestion.   Eyes:  Negative for blurred vision.  Respiratory:  Negative for cough, shortness of breath and wheezing.   Cardiovascular:  Negative for chest pain and palpitations.  Gastrointestinal:  Negative for heartburn.  Genitourinary:  Negative for dysuria.  Musculoskeletal:  Negative for myalgias.  Skin:  Negative for rash.  Neurological:  Negative for dizziness and headaches.  Psychiatric/Behavioral:  Negative for depression and suicidal ideas.   All other systems reviewed and are negative.       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)   Coronary artery calcification   Diverticulitis of large intestine with perforation and abscess   GERD (gastroesophageal reflux disease)   History of diverticulitis of colon   History of DVT (deep vein thrombosis)   Hyperlipidemia   Irritant contact dermatitis associated with fecal stoma   Nonhealing nonsurgical wound        Past Surgical History       Past Surgical History:  Procedure Laterality Date   COLECTOMY W/ COLOSTOMY SURGERY   06/21/2022   ostomy reversal   10/08/2022   LIPOMA/ARM Left          Allergies  No Known Allergies     Medications Ordered Prior to Encounter        Current Outpatient Medications on File Prior to Visit  Medication Sig Dispense Refill   bisacodyL (DULCOLAX) 5 mg EC tablet Take 4 tablets (20 mg total) by mouth once daily as needed for Constipation for up to 1 dose 4 tablet 0   ELIQUIS 5 mg tablet Take 5 mg by mouth 2 (two) times daily       ezetimibe (ZETIA) 10 mg tablet Take 10 mg by mouth once daily  omeprazole (PRILOSEC OTC) 20 MG EC tablet Take 20 mg by mouth once daily       rosuvastatin (CRESTOR) 10 MG tablet Take 1 tablet by mouth once daily        No current facility-administered medications on file prior to visit.        Family History       Family History  Problem Relation Age of Onset   Colon cancer Father     Breast cancer Sister     Lung cancer Brother          Tobacco Use History  Social History        Tobacco Use  Smoking Status Former   Current packs/day: 0.00   Types: Cigarettes   Quit date: 2010   Years since quitting: 14.8  Smokeless Tobacco Never        Social History  Social History         Socioeconomic History   Marital status: Married  Tobacco Use   Smoking status: Former      Current packs/day: 0.00      Types:  Cigarettes      Quit date: 2010      Years since quitting: 14.8   Smokeless tobacco: Never  Substance and Sexual Activity   Alcohol use: Yes   Drug use: Never    Social Drivers of Metallurgist Insecurity: No Food Insecurity (10/08/2022)    Received from [facility]    Hunger Vital Sign     Worried About Running Out of Food in the Last Year: Never true     Ran Out of Food in the Last Year: Never true  Transportation Needs: No Transportation Needs (10/08/2022)    Received from [facility]     Lack of Transportation (Medical): No     Lack of Transportation (Non-Medical): No        Objective:         Vitals:    03/15/23 1046  Temp: 36.7 C (98.1 F)  Weight: 96.2 kg (212 lb)    Body mass index is 27.97 kg/m.   Physical Exam Constitutional:      General: He is not in acute distress.    Appearance: Normal appearance.  HENT:     Head: Normocephalic.     Nose: No rhinorrhea.     Mouth/Throat:     Mouth: Mucous membranes are moist.     Pharynx: Oropharynx is clear.  Eyes:     General: No scleral icterus.    Pupils: Pupils are equal, round, and reactive to light.  Cardiovascular:     Rate and Rhythm: Normal rate.     Pulses: Normal pulses.  Pulmonary:     Effort: Pulmonary effort is normal. No respiratory distress.     Breath sounds: No stridor. No wheezing.  Abdominal:     General: Abdomen is flat. There is no distension.     Tenderness: There is no abdominal tenderness. There is no guarding or rebound.     Hernia: A hernia is present. Hernia is present in the ventral area.     Musculoskeletal:        General: Normal range of motion.     Cervical back: Normal range of motion and neck supple.  Skin:    General: Skin is warm and dry.     Capillary Refill: Capillary refill takes less than 2 seconds.  Coloration: Skin is not jaundiced.  Neurological:     General: No focal deficit present.     Mental Status: He is alert and  oriented to person, place, and time. Mental status is at baseline.  Psychiatric:        Mood and Affect: Mood normal.        Thought Content: Thought content normal.        Judgment: Judgment normal.        Hernia Size: 6 cm Incarcerated: Yes Recurrent Hernia   Assessment and Plan:  Diagnoses and all orders for this visit:   Incisional hernia without obstruction or gangrene     Dacorian Planty is a 68 y.o. male    Patient with a large midline incisional hernia.  This will likely require a retrorectus versus possible T AR hernia repair with mesh.  We also discussed lyse of adhesions.    We will proceed to the OR for a open incisional hernia repair with mesh and lysis of adhesions All risks and benefits were discussed with the patient, to generally include infection, bleeding, damage to surrounding structures, acute and chronic nerve pain, and recurrence. Alternatives were offered and described.  All questions were answered and the patient voiced understanding of the procedure and wishes to proceed at this point.             No follow-ups on file.   Axel Filler, MD, [facility], [facility]

## 2023-05-13 NOTE — Transfer of Care (Signed)
 Immediate Anesthesia Transfer of Care Note  Patient: Charles Myers  Procedure(s) Performed: OPEN HERNIA REPAIR INCISIONAL WITH MESH AND LOA (Abdomen)  Patient Location: PACU  Anesthesia Type:General  Level of Consciousness: awake, alert , and oriented  Airway & Oxygen Therapy: Patient Spontanous Breathing and Patient connected to nasal cannula oxygen  Post-op Assessment: Report given to RN and Post -op Vital signs reviewed and stable  Post vital signs: Reviewed and stable  Last Vitals:  Vitals Value Taken Time  BP 126/97 05/13/23 1315  Temp    Pulse 80 05/13/23 1321  Resp 14 05/13/23 1321  SpO2 90 % 05/13/23 1321  Vitals shown include unfiled device data.  Last Pain:  Vitals:   05/13/23 0934  PainSc: 0-No pain         Complications: No notable events documented.

## 2023-05-14 ENCOUNTER — Encounter (HOSPITAL_COMMUNITY): Payer: Self-pay | Admitting: General Surgery

## 2023-05-14 LAB — CBC
HCT: 41.3 % (ref 39.0–52.0)
Hemoglobin: 13.9 g/dL (ref 13.0–17.0)
MCH: 30 pg (ref 26.0–34.0)
MCHC: 33.7 g/dL (ref 30.0–36.0)
MCV: 89.2 fL (ref 80.0–100.0)
Platelets: 199 10*3/uL (ref 150–400)
RBC: 4.63 MIL/uL (ref 4.22–5.81)
RDW: 13.8 % (ref 11.5–15.5)
WBC: 12.6 10*3/uL — ABNORMAL HIGH (ref 4.0–10.5)
nRBC: 0 % (ref 0.0–0.2)

## 2023-05-14 MED ORDER — METHOCARBAMOL 500 MG PO TABS: 500.0000 mg | ORAL_TABLET | Freq: Three times a day (TID) | ORAL | 0 refills | Status: DC

## 2023-05-14 MED ORDER — OXYCODONE HCL 5 MG PO TABS: 5.0000 mg | ORAL_TABLET | ORAL | 0 refills | Status: DC | PRN

## 2023-05-14 MED ORDER — HYDROCODONE-ACETAMINOPHEN 5-325 MG PO TABS
1.0000 | ORAL_TABLET | ORAL | Status: DC | PRN
  Administered 2023-05-14 – 2023-05-16 (×9): 2 via ORAL
  Filled 2023-05-14 (×9): qty 2

## 2023-05-14 MED ORDER — METHOCARBAMOL 500 MG PO TABS
500.0000 mg | ORAL_TABLET | Freq: Three times a day (TID) | ORAL | Status: DC
  Administered 2023-05-14 – 2023-05-16 (×7): 500 mg via ORAL
  Filled 2023-05-14 (×7): qty 1

## 2023-05-14 NOTE — Plan of Care (Signed)
  Problem: Activity: Goal: Risk for activity intolerance will decrease Outcome: Progressing   Problem: Nutrition: Goal: Adequate nutrition will be maintained Outcome: Progressing   Problem: Elimination: Goal: Will not experience complications related to urinary retention Outcome: Progressing   Problem: Pain Management: Goal: General experience of comfort will improve Outcome: Progressing

## 2023-05-14 NOTE — Progress Notes (Signed)
 1 Day Post-Op   Subjective/Chief Complaint: PT doing well with pain as expected. Ambulating in hallway   Objective: Vital signs in last 24 hours: Temp:  [97.9 F (36.6 C)-99.4 F (37.4 C)] 98 F (36.7 C) (01/10 0330) Pulse Rate:  [81-112] 96 (01/10 0330) Resp:  [12-26] 18 (01/10 0330) BP: (112-144)/(62-97) 125/68 (01/10 0330) SpO2:  [90 %-97 %] 93 % (01/10 0330) Last BM Date : 05/13/23  Intake/Output from previous day: 01/09 0701 - 01/10 0700 In: 2311.4 [P.O.:580; I.V.:1631.4; IV Piggyback:100] Out: 1500 [Urine:1450; Blood:50] Intake/Output this shift: Total I/O In: 1109.1 [P.O.:580; I.V.:529.1] Out: 1300 [Urine:1300]  General appearance: alert and cooperative GI: soft, non-tender; bowel sounds normal; no masses,  no organomegaly and inc c/d/i  Lab Results:  No results for input(s): WBC, HGB, HCT, PLT in the last 72 hours. BMET No results for input(s): NA, K, CL, CO2, GLUCOSE, BUN, CREATININE, CALCIUM  in the last 72 hours. PT/INR No results for input(s): LABPROT, INR in the last 72 hours. ABG No results for input(s): PHART, HCO3 in the last 72 hours.  Invalid input(s): PCO2, PO2  Studies/Results: No results found.  Anti-infectives: Anti-infectives (From admission, onward)    Start     Dose/Rate Route Frequency Ordered Stop   05/13/23 0945  ceFAZolin  (ANCEF ) IVPB 2g/100 mL premix        2 g 200 mL/hr over 30 Minutes Intravenous On call to O.R. 05/13/23 0911 05/13/23 1057       Assessment/Plan: s/p Procedure(s): OPEN HERNIA REPAIR INCISIONAL WITH MESH AND LOA (N/A) Advance diet as tol Mobilize Will add robaxin  to see if that helps with pain Hopefully home Sat/Sun  LOS: 1 day    Lynda Leos 05/14/2023

## 2023-05-14 NOTE — Plan of Care (Signed)

## 2023-05-14 NOTE — Progress Notes (Signed)
 Mobility Specialist Progress Note:   05/14/23 1140  Mobility  Activity Ambulated with assistance in hallway  Level of Assistance Standby assist, set-up cues, supervision of patient - no hands on  Assistive Device None  Distance Ambulated (ft) 550 ft  Activity Response Tolerated well  Mobility Referral Yes  Mobility visit 1 Mobility  Mobility Specialist Start Time (ACUTE ONLY) 1140  Mobility Specialist Stop Time (ACUTE ONLY) 1152  Mobility Specialist Time Calculation (min) (ACUTE ONLY) 12 min   Pt agreeable to mobility session. Required no physical assistance to stand and ambulate in hallway. Pt c/o increased abdominal pain with any movement, especially standing/sitting. No unsteadiness noted. Pt back in chair with all needs met.   Therisa Rana Mobility Specialist Please contact via SecureChat or  Rehab office at [phone]

## 2023-05-14 NOTE — Progress Notes (Signed)
 A&Ox4 ST up to 108 Other VSS on RA  No BM this shift. Asked pt if he would like for me to request a stool softener order from MD, pt stated he wanted to wait a little longer. Educated pt on the effects of opioids on bowels.   Per pt: On-going 7/10 abd pain, worsened by activity.  PRN Dilaudid  helps bring pain down to 3/10. PRN Oxy not helping pain.  Scheduled Robaxin  given as ordered.  Pt refused PRN Toradol .

## 2023-05-15 MED ORDER — AMOXICILLIN-POT CLAVULANATE 875-125 MG PO TABS
1.0000 | ORAL_TABLET | Freq: Two times a day (BID) | ORAL | Status: DC
  Administered 2023-05-15 – 2023-05-16 (×3): 1 via ORAL
  Filled 2023-05-15 (×3): qty 1

## 2023-05-15 NOTE — Progress Notes (Addendum)
 [facility] Progress Note  2 Days Post-Op  Subjective: CC:  NAEO. Tolerating diet. +flatus. Better pain control with changes from yesterday.  Objective: Vital signs in last 24 hours: Temp:  [98.6 F (37 C)-99.1 F (37.3 C)] 98.6 F (37 C) (01/11 0746) Pulse Rate:  [94-105] 100 (01/11 0746) Resp:  [17-18] 18 (01/11 0746) BP: (121-133)/(72-85) 130/85 (01/11 0746) SpO2:  [92 %-94 %] 94 % (01/11 0746) Last BM Date : 05/13/23  Intake/Output from previous day: No intake/output data recorded. Intake/Output this shift: No intake/output data recorded.  PE: Gen:  Alert, NAD, pleasant Card:  Regular rate and rhythm, pedal pulses 2+ BL Pulm:  Normal effort, clear to auscultation bilaterally Abd: Soft, mild distention, incision with some erythema proximal aspect.  Skin: warm and dry, no rashes  Psych: A&Ox3   Lab Results:  Recent Labs    05/14/23 0845  WBC 12.6*  HGB 13.9  HCT 41.3  PLT 199   BMET No results for input(s): NA, K, CL, CO2, GLUCOSE, BUN, CREATININE, CALCIUM  in the last 72 hours. PT/INR No results for input(s): LABPROT, INR in the last 72 hours. CMP     Component Value Date/Time   NA 139 05/04/2023 0928   NA 141 05/14/2022 1030   K 3.9 05/04/2023 0928   CL 106 05/04/2023 0928   CO2 27 05/04/2023 0928   GLUCOSE 110 (H) 05/04/2023 0928   BUN 11 05/04/2023 0928   BUN 12 05/14/2022 1030   CREATININE 0.99 05/04/2023 0928   CREATININE 0.90 02/14/2016 0813   CALCIUM  9.7 05/04/2023 0928   PROT 6.8 02/24/2022 0947   ALBUMIN 4.2 02/24/2022 0947   AST 15 02/24/2022 0947   ALT 21 05/14/2022 1030   ALKPHOS 126 (H) 02/24/2022 0947   BILITOT 0.3 02/24/2022 0947   GFRNONAA >60 05/04/2023 9071     Anti-infectives: Anti-infectives (From admission, onward)    Start     Dose/Rate Route Frequency Ordered Stop   05/15/23 1330  amoxicillin -clavulanate (AUGMENTIN ) 875-125 MG per tablet 1 tablet        1 tablet Oral Every 12 hours  05/15/23 1237     05/13/23 0945  ceFAZolin  (ANCEF ) IVPB 2g/100 mL premix        2 g 200 mL/hr over 30 Minutes Intravenous On call to O.R. 05/13/23 0911 05/13/23 1057        Assessment/Plan  POD#2 open ventral hernia repair with mesh, LOA  AFVSS, pain controlled, tolerating PO Question early wound infection - monitor on Augmentin  Possible discharge tomorrow    LOS: 2 days   I reviewed nursing notes, last 24 h vitals and pain scores, last 48 h intake and output, last 24 h labs and trends, and last 24 h imaging results.  This care required straight-forward level of medical decision making.   Almarie Pringle, PA-C [facility] Please see Amion for pager number during day hours 7:00am-4:30pm

## 2023-05-15 NOTE — Progress Notes (Signed)
 Assumed of care of patient @ 1900 from [facility]. Patient in bed watching television denies pain or discomfort, call bell within reach.

## 2023-05-15 NOTE — Plan of Care (Signed)
  Problem: Education: Goal: Knowledge of General Education information will improve Description: Including pain rating scale, medication(s)/side effects and non-pharmacologic comfort measures Outcome: Progressing   Problem: Health Behavior/Discharge Planning: Goal: Ability to manage health-related needs will improve Outcome: Progressing   Problem: Clinical Measurements: Goal: Ability to maintain clinical measurements within normal limits will improve Outcome: Progressing Goal: Diagnostic test results will improve Outcome: Progressing Goal: Respiratory complications will improve Outcome: Progressing   Problem: Activity: Goal: Risk for activity intolerance will decrease Outcome: Progressing   Problem: Nutrition: Goal: Adequate nutrition will be maintained Outcome: Progressing   Problem: Elimination: Goal: Will not experience complications related to urinary retention Outcome: Progressing   Problem: Pain Management: Goal: General experience of comfort will improve Outcome: Progressing

## 2023-05-15 NOTE — Plan of Care (Signed)

## 2023-05-15 NOTE — Discharge Summary (Signed)
 [facility] Discharge Summary   Patient ID: Charles Myers MRN: [id number] DOB/AGE: [birth year] 68 y.o.  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  Procedures Dr. Rubin 05/12/22 - open ventral incisional hernia repair with mesh, LOA  Hospital Course:  Patient admitted for scheduled hernia repair by Dr. Rubin and underwent procedure above. Tolerated procedure well and was transferred to the floor.  Diet was advanced as tolerated.  On POD#2, the patient was voiding well, tolerating diet, ambulating well, pain well controlled, vital signs stable, +flatus and felt stable for discharge home.  Patient will follow up in our office as below and knows to call with questions or concerns.  On 1/11 the patient had some erythema of the proximal aspect of his wound and was started on augmentin . Erythema improved on 1/12 the patient was discharged with return precautions.   Physical Exam: General:  Alert, NAD, pleasant, comfortable Abd:  Soft, ND, mild tenderness, midline incision c/d/I - small amt erythema proximal wound, improved compared to yesterday.  Allergies as of 05/16/2023   No Known Allergies      Medication List     TAKE these medications     amoxicillin -clavulanate 875-125 MG tablet Commonly known as: AUGMENTIN  Take 1 tablet by mouth every 12 (twelve) hours for 6 days.   atorvastatin  20 MG tablet Commonly known as: LIPITOR Take 20 mg by mouth daily.   ezetimibe  10 MG tablet Commonly known as: ZETIA  Take 1 tablet (10 mg total) by mouth daily.   HYDROcodone -acetaminophen  5-325 MG tablet Commonly known as: NORCO/VICODIN Take 2 tablets by mouth every 4 (four) hours as needed for up to 7 days for severe pain (pain score 7-10).   methocarbamol  500 MG tablet Commonly known as: ROBAXIN  Take 1 tablet (500 mg total) by mouth 3 (three) times daily.   multivitamin with minerals tablet Take 1 tablet by mouth daily.   omeprazole 40 MG capsule Commonly known as: PRILOSEC Take 40 mg by mouth daily.          Follow-up Information     Rubin Calamity, MD. Schedule an appointment as soon as possible for a visit in 2 week(s).   Specialty: General Surgery Why: Post op visit Contact information: [address] [place] [address] [place] [address] [phone]                 Signed: Almarie Pringle, [facility] 05/16/2023, 8:06 AM

## 2023-05-16 MED ORDER — AMOXICILLIN-POT CLAVULANATE 875-125 MG PO TABS: 1.0000 | ORAL_TABLET | Freq: Two times a day (BID) | ORAL | 0 refills | Status: AC

## 2023-05-16 MED ORDER — HYDROCODONE-ACETAMINOPHEN 5-325 MG PO TABS: 2.0000 | ORAL_TABLET | ORAL | 0 refills | Status: AC | PRN

## 2023-06-08 ENCOUNTER — Other Ambulatory Visit: Payer: Self-pay | Admitting: Cardiovascular Disease

## 2023-07-09 IMAGING — CT CT CHEST LUNG CANCER SCREENING LOW DOSE W/O CM
2 of 5 series · 15 of 40 positions shown, 18 images · non-contrast
Comparison: 04/11/2020

CLINICAL DATA: 65-year-old male with 30 pack-year history of
smoking. Lung cancer screening.



[Series 4: lung 1.00 br44 cor · coronal · 0.62mm/px · 3 of 322 slices shown]
[im 65/322  lung]
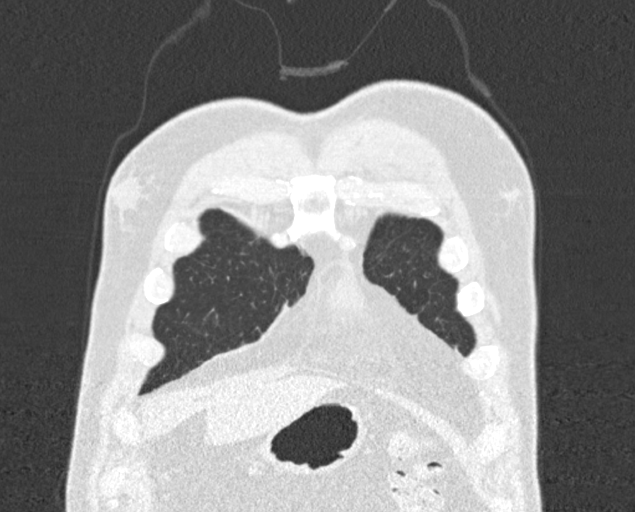
[im 129/322  lung]
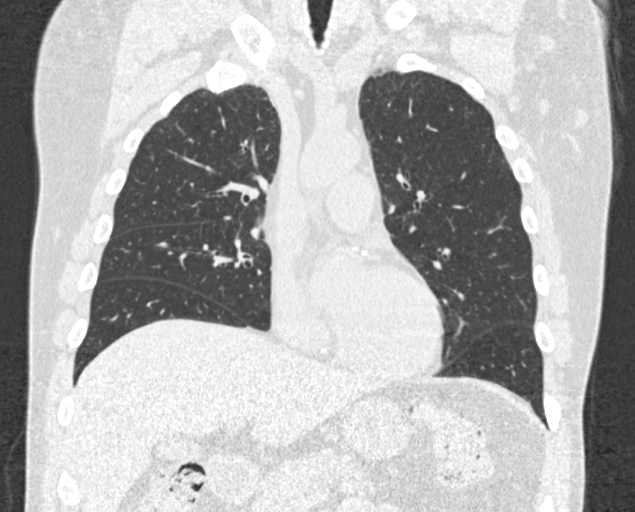
[im 193/322  lung]
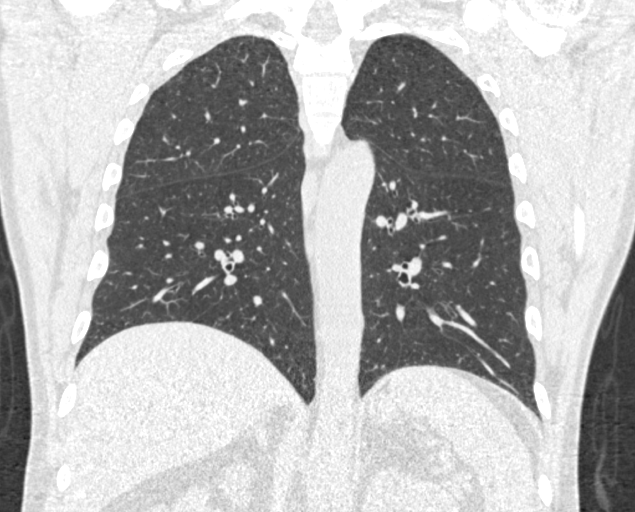

[Series 9: lung 1.00 br60 axial · axial · 0.70mm/px · z∈[-1153,-867]mm · 12 of 316 slices shown, 15 images]
[im 15/316  mediastinal]
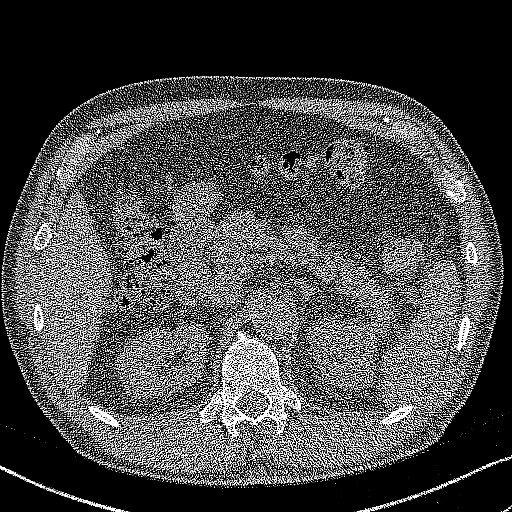
[im 15/316  lung]
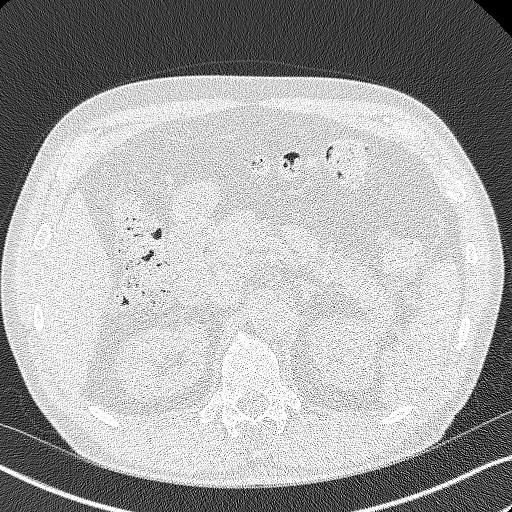
[im 43/316  lung]
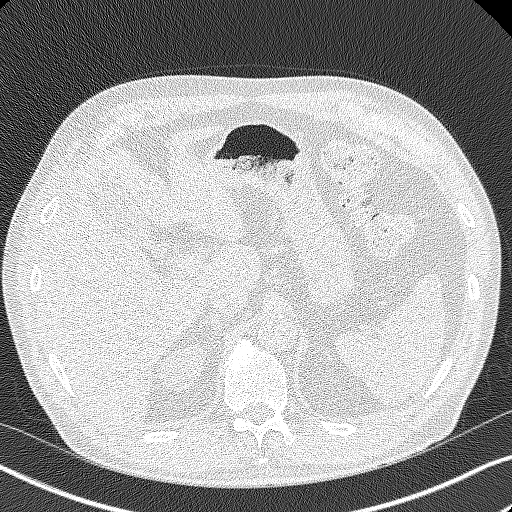
[im 72/316  lung]
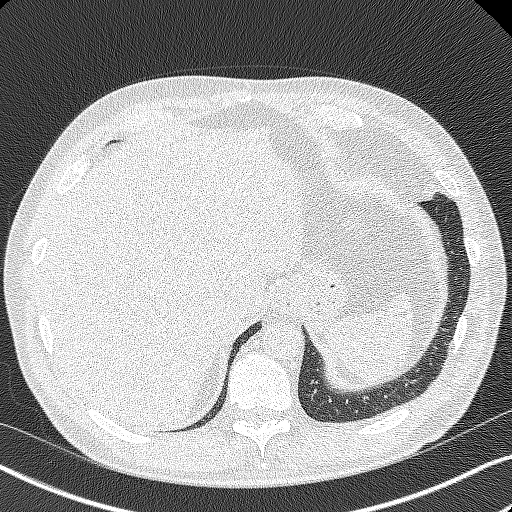
[im 101/316  lung]
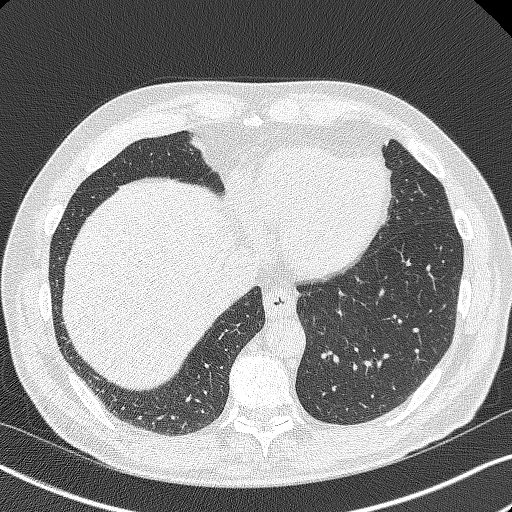
[im 115/316  mediastinal]
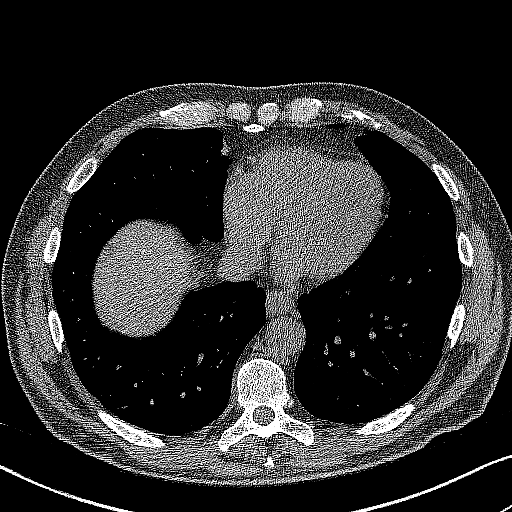
[im 115/316  lung]
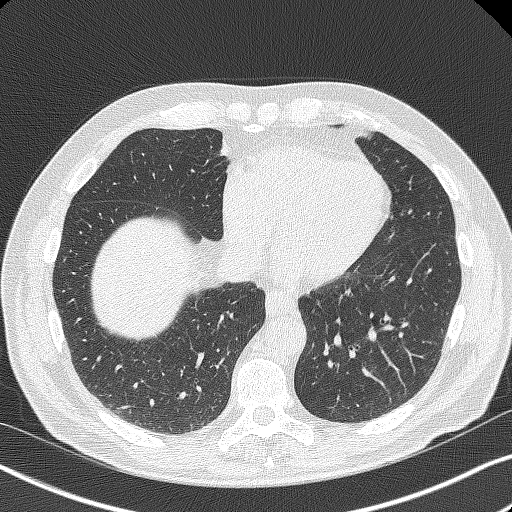
[im 144/316  lung]
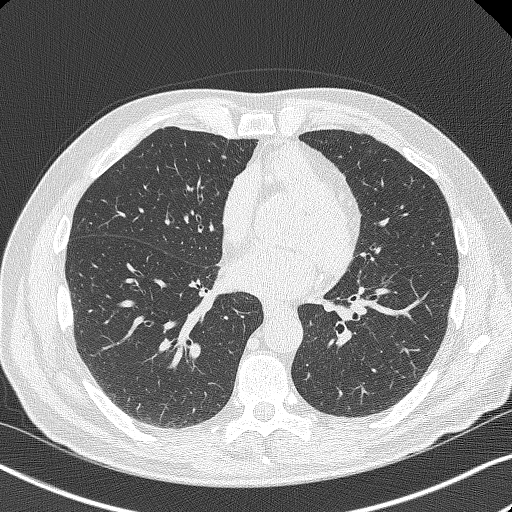
[im 172/316  lung]
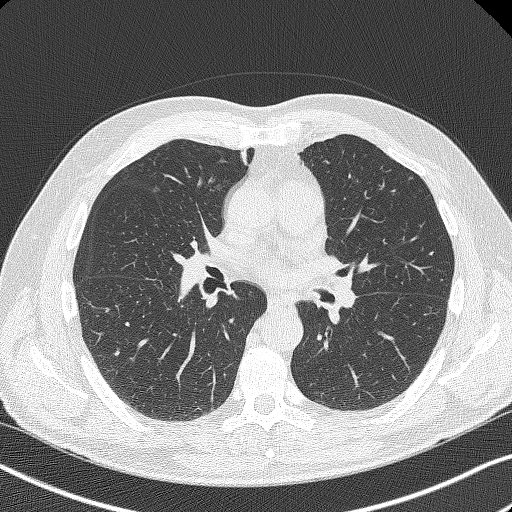
[im 201/316  lung]
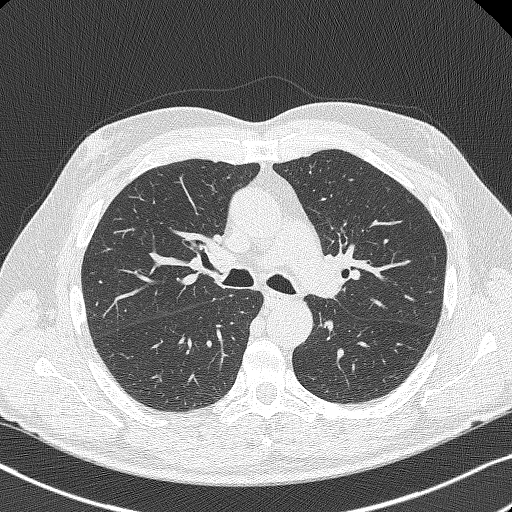
[im 215/316  mediastinal]
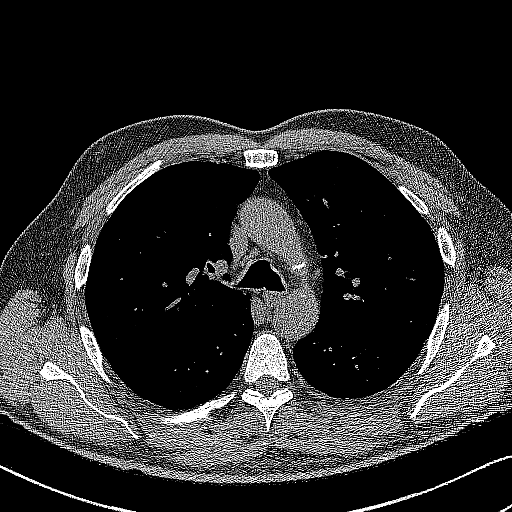
[im 215/316  lung]
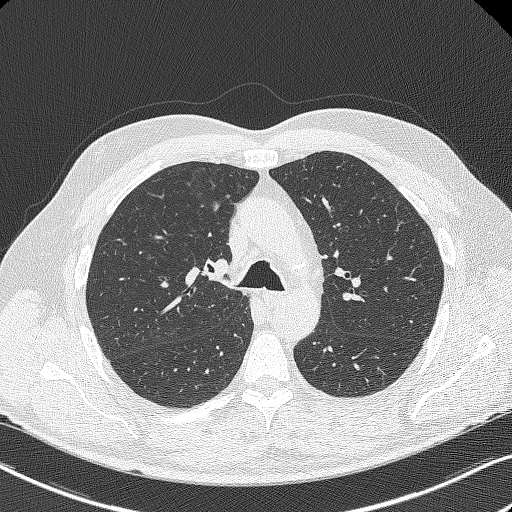
[im 244/316  lung]
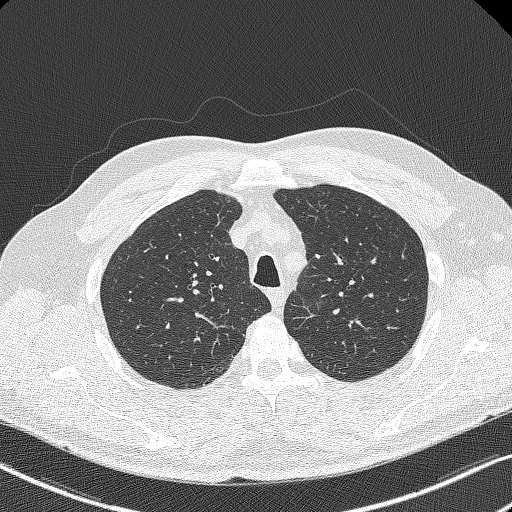
[im 273/316  lung]
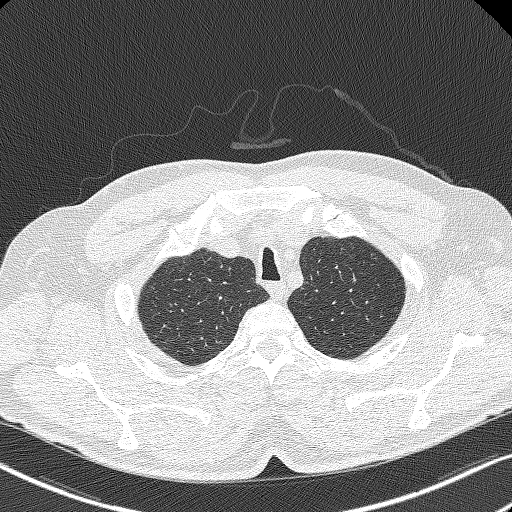
[im 301/316  lung]
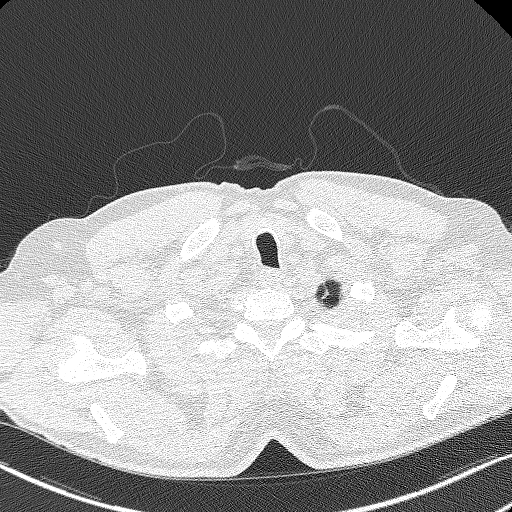

[15 of 40 positions shown; findings below may reference images not displayed]

FINDINGS: Cardiovascular: The heart size is normal. No substantial pericardial
effusion. Coronary artery calcification is evident. Mild
atherosclerotic calcification is noted in the wall of the thoracic
aorta.

Mediastinum/Nodes: No mediastinal lymphadenopathy. No evidence for
gross hilar lymphadenopathy although assessment is limited by the
lack of intravenous contrast on the current study. The esophagus has
normal imaging features. There is no axillary lymphadenopathy.

Lungs/Pleura: Centrilobular emphsyema noted. Tiny right apical
nodule identified previously is stable in the interval. No new
suspicious nodule or mass. No focal airspace consolidation. No
pleural effusion.

Upper Abdomen: Unremarkable.

Musculoskeletal: No worrisome lytic or sclerotic osseous
abnormality.
IMPRESSION: 1. Lung-RADS 2, benign appearance or behavior. Continue annual
screening with low-dose chest CT without contrast in 12 months.
2. Aortic Atherosclerosis (NESUO-4PM.M) and Emphysema (NESUO-77R.R).

## 2023-07-29 ENCOUNTER — Other Ambulatory Visit: Payer: Self-pay

## 2023-07-29 DIAGNOSIS — Z122 Encounter for screening for malignant neoplasm of respiratory organs: Secondary | ICD-10-CM

## 2023-07-29 DIAGNOSIS — Z87891 Personal history of nicotine dependence: Secondary | ICD-10-CM

## 2023-08-15 ENCOUNTER — Encounter: Payer: Self-pay | Admitting: Cardiovascular Disease

## 2023-08-15 NOTE — Progress Notes (Unsigned)
 Cardiology Office Note:    Date:  08/16/2023   ID:  Charles Myers, DOB 1956-02-14, MRN [id number]  PCP:  Ronna Coho, MD   [facility] Providers Cardiologist:  Semir Brill   }    Referring MD: Ronna Coho, MD   Chief Complaint  Patient presents with   coronary artery calcification   Hyperlipidemia    History of Present Illness:    Charles Myers is a 68 y.o. male with a hx of coronary artery calcification , HLD .  Has been active  Cycles several times a week .  I saw him back in 2017.  He has routine lung x-rays and scans because of a history of cigarette smoking.  He was incidentally found to have coronary artery calcifications at that time.  He had a coronary calcium scan on Sep 23, 2021.  His score was 468 which places him in the 82nd percentile for age and sex matched controls. No cp, no dyspnea    Last LDL was 79.    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 in [place]    COLONOSCOPY     COLOSTOMY REVERSAL N/A 10/08/2022   Procedure: OPEN COLOSTOMY REVERSAL, CLOSURE OF COLOSTOMY;  Surgeon: Joyce Nixon, MD;  Location: [facility] ORS;  Service: General;  Laterality: N/A;   INCISIONAL HERNIA REPAIR N/A 05/13/2023   Procedure: OPEN HERNIA REPAIR  INCISIONAL WITH MESH AND LOA;  Surgeon: Shela Derby, MD;  Location: [facility] OR;  Service: General;  Laterality: N/A;   LIPOMA EXCISION Left 07/27/2014   Procedure: EXCISION LIPOMA LEFT UPPER ARM;  Surgeon: Dareen Ebbing, MD;  Location: [facility] OR;  Service: General;  Laterality: Left;   LIPOMA EXCISION N/A 03/03/2017   Procedure: EXCISION OF LIPOMAS OF LEFT SHOULDER AND BACK OF NECK;  Surgeon: Dareen Ebbing, MD;  Location: [facility];  Service: General;  Laterality: N/A;   TONSILLECTOMY     VASECTOMY      Current Medications: Current Meds  Medication Sig   atorvastatin (LIPITOR) 20 MG tablet Take 20 mg by mouth daily.   ezetimibe (ZETIA) 10 MG tablet Take 1 tablet (10 mg total) by mouth daily.   Multiple Vitamins-Minerals (MULTIVITAMIN WITH MINERALS) tablet Take 1 tablet by mouth daily.   omeprazole (PRILOSEC) 40 MG capsule Take 40 mg by mouth daily.     Allergies:   Patient has no known allergies.   Social History   Socioeconomic History   Marital status: Married    Spouse name: Not on file   Number of children: Not on file   Years of education: Not on file   Highest education level: Not on file  Occupational History  Not on file  Tobacco Use   Smoking status: Former    Current packs/day: 0.00    Average packs/day: 1 pack/day for 30.0 years (30.0 ttl pk-yrs)    Types: Cigarettes    Start date: 05/04/1978    Quit date: 05/04/2008    Years since quitting: 15.2   Smokeless tobacco: Never   Tobacco comments:    Reinforced importance of remaining smoke free.  Vaping Use   Vaping status: Never Used  Substance and Sexual Activity   Alcohol use: Yes    Alcohol/week: 6.0 standard drinks of alcohol    Types: 6 Cans of beer per week   Drug use: No   Sexual activity: Yes  Other Topics Concern   Not on file  Social History Narrative   Not on file   Social Drivers of Health   Financial Resource Strain: Not on file  Food Insecurity: No Food Insecurity (05/13/2023)    Hunger Vital Sign    Worried About Running Out of Food in the Last Year: Never true    Ran Out of Food in the Last Year: Never true  Transportation Needs: No Transportation Needs (05/13/2023)   PRAPARE - Administrator, Civil Service (Medical): No    Lack of Transportation (Non-Medical): No  Physical Activity: Not on file  Stress: Not on file  Social Connections: Moderately Isolated (05/13/2023)   Social Connection and Isolation Panel [NHANES]    Frequency of Communication with Friends and Family: Three times a week    Frequency of Social Gatherings with Friends and Family: Twice a week    Attends Religious Services: Never    Database administrator or Organizations: No    Attends Engineer, structural: Never    Marital Status: Married     Family History: The patient's family history includes Breast cancer in his sister; Colon cancer in his father; Lung cancer in his brother.  ROS:   Please see the history of present illness.     All other systems reviewed and are negative.  EKGs/Labs/Other Studies Reviewed:    The following studies were reviewed today:   EKG:     EKG Interpretation Date/Time:  Monday August 16 2023 14:51:43 EDT Ventricular Rate:  85 PR Interval:  174 QRS Duration:  90 QT Interval:  366 QTC Calculation: 435 R Axis:   70  Text Interpretation: Sinus rhythm with occasional Premature ventricular complexes No previous ECGs available Confirmed by Ahmad Alert ([id number]) on 08/16/2023 3:02:03 PM    Recent Labs: 05/04/2023: BUN 11; Creatinine, Ser 0.99; Potassium 3.9; Sodium 139 05/14/2023: Hemoglobin 13.9; Platelets 199  Recent Lipid Panel    Component Value Date/Time   CHOL 98 (L) 05/14/2022 1030   TRIG 65 05/14/2022 1030   HDL 35 (L) 05/14/2022 1030   CHOLHDL 2.8 05/14/2022 1030   CHOLHDL 3.8 02/14/2016 0813   VLDL 24 02/14/2016 0813   LDLCALC 49 05/14/2022 1030     Risk Assessment/Calculations:           Physical Exam:      Physical Exam: Blood pressure 124/72, pulse 82, height 6\' 1"  (1.854 m), weight 205 lb 9.6 oz (93.3 kg), SpO2 95%.       GEN:  Well nourished, well developed in no acute distress HEENT: Normal NECK: No JVD; No carotid bruits LYMPHATICS: No lymphadenopathy CARDIAC: RRR , rare premature beat  RESPIRATORY:  Clear to auscultation without rales, wheezing or rhonchi  ABDOMEN: Soft, non-tender, non-distended MUSCULOSKELETAL:  No edema; No deformity  SKIN: Warm and dry NEUROLOGIC:  Alert and oriented x 3   ASSESSMENT:    1. Moderate mixed hyperlipidemia not requiring statin therapy     PLAN:     Coronary artery calcifications: He has coronary artery calcium score of 468.  I would like for his LDL to be between 50 and 70.    2. HLD :   His last LDL was 51.   Continue  zetia 10 mg  atorvastatin 20 mg a day          Medication Adjustments/Labs and Tests Ordered: Current medicines are reviewed at length with the patient today.  Concerns regarding medicines are outlined above.  Orders Placed This Encounter  Procedures   EKG 12-Lead   No orders of the defined types were placed in this encounter.   There are no Patient Instructions on file for this visit.   Signed, Ahmad Alert, MD  08/16/2023 3:02 PM    [facility]

## 2023-08-16 ENCOUNTER — Ambulatory Visit: Payer: Medicare Other | Attending: Cardiovascular Disease | Admitting: Cardiovascular Disease

## 2023-08-16 ENCOUNTER — Encounter: Payer: Self-pay | Admitting: Cardiovascular Disease

## 2023-08-16 VITALS — BP 124/72 | HR 82 | Ht 73.0 in | Wt 205.6 lb

## 2023-08-16 DIAGNOSIS — E782 Mixed hyperlipidemia: Secondary | ICD-10-CM | POA: Insufficient documentation

## 2023-08-16 NOTE — Patient Instructions (Signed)
 Medication Instructions:  Your physician recommends that you continue on your current medications as directed. Please refer to the Current Medication list given to you today.  *If you need a refill on your cardiac medications before your next appointment, please call your pharmacy*  Follow-Up: At [facility], you and your health needs are our priority.  As part of our continuing mission to provide you with exceptional heart care, we have created designated Provider Care Teams.  These Care Teams include your primary Cardiologist (physician) and Advanced Practice Providers (APPs -  Physician Assistants and Nurse Practitioners) who all work together to provide you with the care you need, when you need it.  Your next appointment:   1 year(s)  The format for your next appointment:   In Person  Provider:   Kristeen Miss, MD {  Other Instructions   1st Floor: - Lobby - Registration  - Pharmacy  - Lab - Cafe  2nd Floor: - PV Lab - Diagnostic Testing (echo, CT, nuclear med)  3rd Floor: - Vacant  4th Floor: - TCTS (cardiothoracic surgery) - AFib Clinic - Structural Heart Clinic - Vascular Surgery  - Vascular Ultrasound  5th Floor: - HeartCare Cardiology (general and EP) - Clinical Pharmacy for coumadin, hypertension, lipid, weight-loss medications, and med management appointments    Valet parking services will be available as well.

## 2023-08-19 ENCOUNTER — Ambulatory Visit
Admission: RE | Admit: 2023-08-19 | Discharge: 2023-08-19 | Disposition: A | Discharge status: Home / Self Care | Source: Ambulatory Visit | Attending: Family Medicine | Admitting: Family Medicine

## 2023-08-19 DIAGNOSIS — Z87891 Personal history of nicotine dependence: Secondary | ICD-10-CM

## 2023-08-19 DIAGNOSIS — Z122 Encounter for screening for malignant neoplasm of respiratory organs: Secondary | ICD-10-CM

## 2023-08-24 ENCOUNTER — Other Ambulatory Visit: Payer: Self-pay | Admitting: General Surgery

## 2023-08-24 DIAGNOSIS — Z8719 Personal history of other diseases of the digestive system: Secondary | ICD-10-CM

## 2023-09-02 ENCOUNTER — Ambulatory Visit
Admission: RE | Admit: 2023-09-02 | Discharge: 2023-09-02 | Disposition: A | Discharge status: Home / Self Care | Source: Ambulatory Visit | Attending: General Surgery | Admitting: General Surgery

## 2023-09-02 DIAGNOSIS — Z8719 Personal history of other diseases of the digestive system: Secondary | ICD-10-CM

## 2023-09-09 ENCOUNTER — Ambulatory Visit: Payer: Self-pay | Admitting: General Surgery

## 2023-09-09 NOTE — H&P (View-Only) (Signed)
 Charles Myers is a 68 year old male who presents with a recurrence of a hernia.   A recent CT scan shows a 3 cm bulge at the upper limits of the previously placed mesh near the chest plate, composed of fat from the falciform ligament, without intestinal involvement. He notices the bulge but has no pain or discomfort. He is concerned about the recurrence and the failure of the initial repair performed several months ago.   His past surgical history includes an open hernia repair with complications, including a perforation requiring urgent intervention. He recalls extensive packing of the incision site during recovery.   He uses a supportive belt for security, especially during activities like going to the gym, and adheres to lifting restrictions.   He is planning a cruise around [place] on June 27th and is concerned about the recovery timeline. He has dental work scheduled for May 27th, which he is willing to reschedule if necessary for surgical intervention.   Review of Systems  Constitutional:  Negative for chills and fever.  HENT:  Negative for ear pain and sore throat.   Eyes:  Negative for pain and visual disturbance.  Respiratory:  Negative for cough and shortness of breath.   Cardiovascular:  Negative for chest pain and palpitations.  Gastrointestinal:  Negative for abdominal pain and vomiting.  Genitourinary:  Negative for dysuria and hematuria.  Musculoskeletal:  Negative for arthralgias and back pain.  Skin:  Negative for color change and rash.  Neurological:  Negative for seizures and syncope.  All other systems reviewed and are negative.     Problem List Patient Active Problem List Diagnosis  Anxiety  Colostomy complication (CMS/HHS-HCC)  Colostomy in place (CMS/HHS-HCC)  Coronary artery calcification  Diverticulitis of large intestine with perforation and abscess  GERD (gastroesophageal reflux disease)  History of diverticulitis of colon  History of DVT (deep vein  thrombosis)  Hyperlipidemia  Irritant contact dermatitis associated with fecal stoma  Nonhealing nonsurgical wound      Medications Prior to Visit Outpatient Medications Prior to Visit Medication Sig Dispense Refill  atorvastatin  (LIPITOR) 20 MG tablet Oral for 40 Days      ezetimibe  (ZETIA ) 10 mg tablet Take 10 mg by mouth once daily      omeprazole (PRILOSEC OTC) 20 MG EC tablet Take 20 mg by mouth once daily      bisacodyL  (DULCOLAX) 5 mg EC tablet Take 4 tablets (20 mg total) by mouth once daily as needed for Constipation for up to 1 dose 4 tablet 0  ELIQUIS 5 mg tablet Take 5 mg by mouth 2 (two) times daily      rosuvastatin  (CRESTOR ) 10 MG tablet Take 1 tablet by mouth once daily (Patient not taking: Reported on 09/09/2023)        No facility-administered medications prior to visit.      Objective    Vitals:   09/09/23 1425 PainSc: 0-No pain   There is no height or weight on file to calculate BMI.   Home Vitals:      Physical Exam Physical Exam       PE:   Constitutional: No acute distress, conversant, appears states age. Eyes: Anicteric sclerae, moist conjunctiva, no lid lag Lungs: Clear to auscultation bilaterally, normal respiratory effort CV: regular rate and rhythm, no murmurs, no peripheral edema, pedal pulses 2+ GI: Soft, no masses or hepatosplenomegaly, non-tender to palpation, palp hernia in superior aspect of incision Skin: No rashes, palpation reveals normal turgor Psychiatric: appropriate judgment  and insight, oriented to person, place, and time   Results RADIOLOGY CT scan: Recurrence of hernia, 3 cm, with fat protruding near the upper limits of the mesh, underneath the sternum. No intestines involved.     Assessment/Plan:   Assessment & Plan Recurrent abdominal hernia   Plan for lap VHR with mesh. All risks and benefits were discussed with the patient, to generally include infection, bleeding, damage to surrounding structures, acute and  chronic nerve pain, and recurrence. Alternatives were offered and described.  All questions were answered and the patient voiced understanding of the procedure and wishes to proceed at this point.     Diagnoses and all orders for this visit:   Recurrent ventral hernia

## 2023-09-09 NOTE — H&P (Signed)
 Charles Myers is a 68 year old male who presents with a recurrence of a hernia.   A recent CT scan shows a 3 cm bulge at the upper limits of the previously placed mesh near the chest plate, composed of fat from the falciform ligament, without intestinal involvement. He notices the bulge but has no pain or discomfort. He is concerned about the recurrence and the failure of the initial repair performed several months ago.   His past surgical history includes an open hernia repair with complications, including a perforation requiring urgent intervention. He recalls extensive packing of the incision site during recovery.   He uses a supportive belt for security, especially during activities like going to the gym, and adheres to lifting restrictions.   He is planning a cruise around [place] on June 27th and is concerned about the recovery timeline. He has dental work scheduled for May 27th, which he is willing to reschedule if necessary for surgical intervention.   Review of Systems  Constitutional:  Negative for chills and fever.  HENT:  Negative for ear pain and sore throat.   Eyes:  Negative for pain and visual disturbance.  Respiratory:  Negative for cough and shortness of breath.   Cardiovascular:  Negative for chest pain and palpitations.  Gastrointestinal:  Negative for abdominal pain and vomiting.  Genitourinary:  Negative for dysuria and hematuria.  Musculoskeletal:  Negative for arthralgias and back pain.  Skin:  Negative for color change and rash.  Neurological:  Negative for seizures and syncope.  All other systems reviewed and are negative.     Problem List Patient Active Problem List Diagnosis  Anxiety  Colostomy complication (CMS/HHS-HCC)  Colostomy in place (CMS/HHS-HCC)  Coronary artery calcification  Diverticulitis of large intestine with perforation and abscess  GERD (gastroesophageal reflux disease)  History of diverticulitis of colon  History of DVT (deep vein  thrombosis)  Hyperlipidemia  Irritant contact dermatitis associated with fecal stoma  Nonhealing nonsurgical wound      Medications Prior to Visit Outpatient Medications Prior to Visit Medication Sig Dispense Refill  atorvastatin  (LIPITOR) 20 MG tablet Oral for 40 Days      ezetimibe  (ZETIA ) 10 mg tablet Take 10 mg by mouth once daily      omeprazole (PRILOSEC OTC) 20 MG EC tablet Take 20 mg by mouth once daily      bisacodyL  (DULCOLAX) 5 mg EC tablet Take 4 tablets (20 mg total) by mouth once daily as needed for Constipation for up to 1 dose 4 tablet 0  ELIQUIS 5 mg tablet Take 5 mg by mouth 2 (two) times daily      rosuvastatin  (CRESTOR ) 10 MG tablet Take 1 tablet by mouth once daily (Patient not taking: Reported on 09/09/2023)        No facility-administered medications prior to visit.      Objective    Vitals:   09/09/23 1425 PainSc: 0-No pain   There is no height or weight on file to calculate BMI.   Home Vitals:      Physical Exam Physical Exam       PE:   Constitutional: No acute distress, conversant, appears states age. Eyes: Anicteric sclerae, moist conjunctiva, no lid lag Lungs: Clear to auscultation bilaterally, normal respiratory effort CV: regular rate and rhythm, no murmurs, no peripheral edema, pedal pulses 2+ GI: Soft, no masses or hepatosplenomegaly, non-tender to palpation, palp hernia in superior aspect of incision Skin: No rashes, palpation reveals normal turgor Psychiatric: appropriate judgment  and insight, oriented to person, place, and time   Results RADIOLOGY CT scan: Recurrence of hernia, 3 cm, with fat protruding near the upper limits of the mesh, underneath the sternum. No intestines involved.     Assessment/Plan:   Assessment & Plan Recurrent abdominal hernia   Plan for lap VHR with mesh. All risks and benefits were discussed with the patient, to generally include infection, bleeding, damage to surrounding structures, acute and  chronic nerve pain, and recurrence. Alternatives were offered and described.  All questions were answered and the patient voiced understanding of the procedure and wishes to proceed at this point.     Diagnoses and all orders for this visit:   Recurrent ventral hernia

## 2023-09-13 NOTE — Progress Notes (Signed)
 Surgical Instructions   Your procedure is scheduled on Thursday, May 15th. Report to [facility] Main Entrance "A" at 10:45 A.M., then check in with the Admitting office. Any questions or running late day of surgery: call [phone]  Questions prior to your surgery date: call [phone], Monday-Friday, 8am-4pm. If you experience any cold or flu symptoms such as cough, fever, chills, shortness of breath, etc. between now and your scheduled surgery, please notify us  at the above number.     Remember:  Do not eat after midnight the night before your surgery   You may drink clear liquids until 9:45 the morning of your surgery.   Clear liquids allowed are: Water , Non-Citrus Juices (without pulp), Carbonated Beverages, Clear Tea (no milk, honey, etc.), Black Coffee Only (NO MILK, CREAM OR POWDERED CREAMER of any kind), and Gatorade.  Patient Instructions  The night before surgery:  No food after midnight. ONLY clear liquids after midnight  The day of surgery (if you do NOT have diabetes):  Drink ONE (1) Pre-Surgery Clear Ensure by 9:45 the morning of surgery. Drink in one sitting. Do not sip.  This drink was given to you during your hospital pre-op appointment visit.  Nothing else to drink after completing the Pre-Surgery Clear Ensure.         If you have questions, please contact your surgeon's office.    Take these medicines the morning of surgery with A SIP OF WATER   omeprazole (PRILOSEC)    One week prior to surgery, STOP taking any Aspirin (unless otherwise instructed by your surgeon) Aleve, Naproxen, Ibuprofen, Motrin, Advil, Goody's, BC's, all herbal medications, fish oil, and non-prescription vitamins.                     Do NOT Smoke (Tobacco/Vaping) for 24 hours prior to your procedure.  If you use a CPAP at night, you may bring your mask/headgear for your overnight stay.   You will be asked to remove any contacts, glasses, piercing's, hearing aid's,  dentures/partials prior to surgery. Please bring cases for these items if needed.    Patients discharged the day of surgery will not be allowed to drive home, and someone needs to stay with them for 24 hours.  SURGICAL WAITING ROOM VISITATION Patients may have no more than 2 support people in the waiting area - these visitors may rotate.   Pre-op nurse will coordinate an appropriate time for 1 ADULT support person, who may not rotate, to accompany patient in pre-op.  Children under the age of 37 must have an adult with them who is not the patient and must remain in the main waiting area with an adult.  If the patient needs to stay at the hospital during part of their recovery, the visitor guidelines for inpatient rooms apply.  Please refer to the [facility] website for the visitor guidelines for any additional information.   If you received a COVID test during your pre-op visit  it is requested that you wear a mask when out in public, stay away from anyone that may not be feeling well and notify your surgeon if you develop symptoms. If you have been in contact with anyone that has tested positive in the last 10 days please notify you surgeon.      Pre-operative CHG Bathing Instructions   You can play a key role in reducing the risk of infection after surgery. Your skin needs to be as free of germs as possible. You can  reduce the number of germs on your skin by washing with CHG (chlorhexidine  gluconate) soap before surgery. CHG is an antiseptic soap that kills germs and continues to kill germs even after washing.   DO NOT use if you have an allergy to chlorhexidine /CHG or antibacterial soaps. If your skin becomes reddened or irritated, stop using the CHG and notify one of our RNs at [phone].              TAKE A SHOWER THE NIGHT BEFORE SURGERY AND THE DAY OF SURGERY    Please keep in mind the following:  DO NOT shave, including legs and underarms, 48 hours prior to surgery.   You may  shave your face before/day of surgery.  Place clean sheets on your bed the night before surgery Use a clean washcloth (not used since being washed) for each shower. DO NOT sleep with pet's night before surgery.  CHG Shower Instructions:  Wash your face and private area with normal soap. If you choose to wash your hair, wash first with your normal shampoo.  After you use shampoo/soap, rinse your hair and body thoroughly to remove shampoo/soap residue.  Turn the water  OFF and apply half the bottle of CHG soap to a CLEAN washcloth.  Apply CHG soap ONLY FROM YOUR NECK DOWN TO YOUR TOES (washing for 3-5 minutes)  DO NOT use CHG soap on face, private areas, open wounds, or sores.  Pay special attention to the area where your surgery is being performed.  If you are having back surgery, having someone wash your back for you may be helpful. Wait 2 minutes after CHG soap is applied, then you may rinse off the CHG soap.  Pat dry with a clean towel  Put on clean pajamas    Additional instructions for the day of surgery: DO NOT APPLY any lotions, deodorants, cologne, or perfumes.   Do not wear jewelry or makeup Do not wear nail polish, gel polish, artificial nails, or any other type of covering on natural nails (fingers and toes) Do not bring valuables to the hospital. [facility] is not responsible for valuables/personal belongings. Put on clean/comfortable clothes.  Please brush your teeth.  Ask your nurse before applying any prescription medications to the skin.

## 2023-09-14 ENCOUNTER — Encounter (HOSPITAL_COMMUNITY): Payer: Self-pay

## 2023-09-14 ENCOUNTER — Encounter (HOSPITAL_COMMUNITY)
Admission: RE | Admit: 2023-09-14 | Discharge: 2023-09-14 | Disposition: A | Discharge status: Home / Self Care | Source: Ambulatory Visit | Attending: General Surgery | Admitting: General Surgery

## 2023-09-14 ENCOUNTER — Other Ambulatory Visit: Payer: Self-pay

## 2023-09-14 VITALS — BP 128/81 | HR 81 | Temp 97.8°F | Resp 19 | Ht 73.0 in | Wt 205.7 lb

## 2023-09-14 DIAGNOSIS — Z01818 Encounter for other preprocedural examination: Secondary | ICD-10-CM | POA: Insufficient documentation

## 2023-09-14 LAB — CBC
HCT: 46.5 % (ref 39.0–52.0)
Hemoglobin: 15.2 g/dL (ref 13.0–17.0)
MCH: 29.2 pg (ref 26.0–34.0)
MCHC: 32.7 g/dL (ref 30.0–36.0)
MCV: 89.4 fL (ref 80.0–100.0)
Platelets: 218 10*3/uL (ref 150–400)
RBC: 5.2 MIL/uL (ref 4.22–5.81)
RDW: 13.9 % (ref 11.5–15.5)
WBC: 9.6 10*3/uL (ref 4.0–10.5)
nRBC: 0 % (ref 0.0–0.2)

## 2023-09-14 LAB — BASIC METABOLIC PANEL WITH GFR
Anion gap: 5 (ref 5–15)
BUN: 9 mg/dL (ref 8–23)
CO2: 28 mmol/L (ref 22–32)
Calcium: 9.6 mg/dL (ref 8.9–10.3)
Chloride: 106 mmol/L (ref 98–111)
Creatinine, Ser: 0.89 mg/dL (ref 0.61–1.24)
GFR, Estimated: >60 mL/min (ref >60)
Glucose, Bld: 103 mg/dL — ABNORMAL HIGH (ref 70–99)
Potassium: 4 mmol/L (ref 3.5–5.1)
Sodium: 139 mmol/L (ref 135–145)

## 2023-09-14 NOTE — Progress Notes (Signed)
 Surgical Instructions   Your procedure is scheduled on Thursday, May 15th, 2025. Report to [facility] Main Entrance "A" at 10:45 A.M., then check in with the Admitting office. Any questions or running late day of surgery: call [phone]  Questions prior to your surgery date: call [phone], Monday-Friday, 8am-4pm. If you experience any cold or flu symptoms such as cough, fever, chills, shortness of breath, etc. between now and your scheduled surgery, please notify us  at the above number.     Remember:  Do not eat after midnight the night before your surgery   You may drink clear liquids until 9:45 the morning of your surgery.   Clear liquids allowed are: Water , Non-Citrus Juices (without pulp), Carbonated Beverages, Clear Tea (no milk, honey, etc.), Black Coffee Only (NO MILK, CREAM OR POWDERED CREAMER of any kind), and Gatorade.    Take these medicines the morning of surgery with A SIP OF WATER : Omeprazole (Prilosec)   May take these medicines IF NEEDED: None.     One week prior to surgery, STOP taking any Aspirin (unless otherwise instructed by your surgeon) Aleve, Naproxen, Ibuprofen, Motrin, Advil, Goody's, BC's, all herbal medications, fish oil, and non-prescription vitamins.                     Do NOT Smoke (Tobacco/Vaping) for 24 hours prior to your procedure.  If you use a CPAP at night, you may bring your mask/headgear for your overnight stay.   You will be asked to remove any contacts, glasses, piercing's, hearing aid's, dentures/partials prior to surgery. Please bring cases for these items if needed.    Patients discharged the day of surgery will not be allowed to drive home, and someone needs to stay with them for 24 hours.  SURGICAL WAITING ROOM VISITATION Patients may have no more than 2 support people in the waiting area - these visitors may rotate.   Pre-op nurse will coordinate an appropriate time for 1 ADULT support person, who may not rotate, to  accompany patient in pre-op.  Children under the age of 54 must have an adult with them who is not the patient and must remain in the main waiting area with an adult.  If the patient needs to stay at the hospital during part of their recovery, the visitor guidelines for inpatient rooms apply.  Please refer to the [facility] website for the visitor guidelines for any additional information.   If you received a COVID test during your pre-op visit  it is requested that you wear a mask when out in public, stay away from anyone that may not be feeling well and notify your surgeon if you develop symptoms. If you have been in contact with anyone that has tested positive in the last 10 days please notify you surgeon.      Pre-operative CHG Bathing Instructions   You can play a key role in reducing the risk of infection after surgery. Your skin needs to be as free of germs as possible. You can reduce the number of germs on your skin by washing with CHG (chlorhexidine  gluconate) soap before surgery. CHG is an antiseptic soap that kills germs and continues to kill germs even after washing.   DO NOT use if you have an allergy to chlorhexidine /CHG or antibacterial soaps. If your skin becomes reddened or irritated, stop using the CHG and notify one of our RNs at [phone].  TAKE A SHOWER THE NIGHT BEFORE SURGERY AND THE DAY OF SURGERY    Please keep in mind the following:  DO NOT shave, including legs and underarms, 48 hours prior to surgery.   You may shave your face before/day of surgery.  Place clean sheets on your bed the night before surgery Use a clean washcloth (not used since being washed) for each shower. DO NOT sleep with pet's night before surgery.  CHG Shower Instructions:  Wash your face and private area with normal soap. If you choose to wash your hair, wash first with your normal shampoo.  After you use shampoo/soap, rinse your hair and body thoroughly to remove  shampoo/soap residue.  Turn the water  OFF and apply half the bottle of CHG soap to a CLEAN washcloth.  Apply CHG soap ONLY FROM YOUR NECK DOWN TO YOUR TOES (washing for 3-5 minutes)  DO NOT use CHG soap on face, private areas, open wounds, or sores.  Pay special attention to the area where your surgery is being performed.  If you are having back surgery, having someone wash your back for you may be helpful. Wait 2 minutes after CHG soap is applied, then you may rinse off the CHG soap.  Pat dry with a clean towel  Put on clean pajamas    Additional instructions for the day of surgery: DO NOT APPLY any lotions, deodorants, cologne, or perfumes.   Do not wear jewelry or makeup Do not wear nail polish, gel polish, artificial nails, or any other type of covering on natural nails (fingers and toes) Do not bring valuables to the hospital. [facility] is not responsible for valuables/personal belongings. Put on clean/comfortable clothes.  Please brush your teeth.  Ask your nurse before applying any prescription medications to the skin.

## 2023-09-14 NOTE — Progress Notes (Signed)
 PCP - Dr. Thurston Flow Marrow Cardiologist - denies  PPM/ICD -denies    Chest x-ray - denies EKG - 08/16/23 Stress Test - denies ECHO - denies Cardiac Cath - denies  Sleep Study - denies  No DM  Last dose of GLP1 agonist-  n/a GLP1 instructions:  n/a  Blood Thinner Instructions: n/a Aspirin Instructions: n/a  ERAS Protcol - clears until 0945 PRE-SURGERY Ensure or G2- no order for Ensure  COVID TEST- n/a   Anesthesia review: no  Patient denies shortness of breath, fever, cough and chest pain at PAT appointment   All instructions explained to the patient, with a verbal understanding of the material. Patient agrees to go over the instructions while at home for a better understanding. Patient also instructed to self quarantine after being tested for COVID-19. The opportunity to ask questions was provided.

## 2023-09-16 ENCOUNTER — Encounter (HOSPITAL_COMMUNITY)
Admission: RE | Disposition: A | Discharge status: Home / Self Care | Payer: Self-pay | Source: Home / Self Care | Attending: General Surgery

## 2023-09-16 ENCOUNTER — Other Ambulatory Visit: Payer: Self-pay

## 2023-09-16 ENCOUNTER — Ambulatory Visit (HOSPITAL_BASED_OUTPATIENT_CLINIC_OR_DEPARTMENT_OTHER): Payer: Self-pay | Admitting: Registered Nurse

## 2023-09-16 ENCOUNTER — Ambulatory Visit (HOSPITAL_COMMUNITY): Payer: Self-pay | Admitting: Registered Nurse

## 2023-09-16 ENCOUNTER — Ambulatory Visit (HOSPITAL_COMMUNITY)
Admission: RE | Admit: 2023-09-16 | Discharge: 2023-09-16 | Disposition: A | Discharge status: Home / Self Care | Attending: General Surgery | Admitting: General Surgery

## 2023-09-16 ENCOUNTER — Encounter (HOSPITAL_COMMUNITY): Payer: Self-pay | Admitting: General Surgery

## 2023-09-16 DIAGNOSIS — I2510 Atherosclerotic heart disease of native coronary artery without angina pectoris: Secondary | ICD-10-CM | POA: Insufficient documentation

## 2023-09-16 DIAGNOSIS — Z933 Colostomy status: Secondary | ICD-10-CM | POA: Insufficient documentation

## 2023-09-16 DIAGNOSIS — K432 Incisional hernia without obstruction or gangrene: Secondary | ICD-10-CM

## 2023-09-16 DIAGNOSIS — J449 Chronic obstructive pulmonary disease, unspecified: Secondary | ICD-10-CM | POA: Diagnosis not present

## 2023-09-16 DIAGNOSIS — Z7901 Long term (current) use of anticoagulants: Secondary | ICD-10-CM | POA: Diagnosis not present

## 2023-09-16 DIAGNOSIS — Z86718 Personal history of other venous thrombosis and embolism: Secondary | ICD-10-CM | POA: Insufficient documentation

## 2023-09-16 DIAGNOSIS — Z87891 Personal history of nicotine dependence: Secondary | ICD-10-CM

## 2023-09-16 DIAGNOSIS — K219 Gastro-esophageal reflux disease without esophagitis: Secondary | ICD-10-CM | POA: Diagnosis not present

## 2023-09-16 HISTORY — PX: VENTRAL HERNIA REPAIR: SHX424

## 2023-09-16 SURGERY — REPAIR, HERNIA, VENTRAL, LAPAROSCOPIC
Anesthesia: General | Site: Abdomen

## 2023-09-16 MED ORDER — SODIUM CHLORIDE 0.9 % IR SOLN
Status: DC | PRN
  Administered 2023-09-16: 1000 mL

## 2023-09-16 MED ORDER — HYDROMORPHONE HCL 1 MG/ML IJ SOLN
INTRAMUSCULAR | Status: AC
  Filled 2023-09-16: qty 0.5

## 2023-09-16 MED ORDER — SODIUM CHLORIDE (PF) 0.9 % IJ SOLN
INTRAMUSCULAR | Status: AC
  Filled 2023-09-16: qty 20

## 2023-09-16 MED ORDER — HYDROMORPHONE HCL 1 MG/ML IJ SOLN
INTRAMUSCULAR | Status: DC | PRN
  Administered 2023-09-16: .5 mg via INTRAVENOUS

## 2023-09-16 MED ORDER — BUPIVACAINE HCL 0.25 % IJ SOLN
INTRAMUSCULAR | Status: DC | PRN
  Administered 2023-09-16: 10 mL

## 2023-09-16 MED ORDER — DROPERIDOL 2.5 MG/ML IJ SOLN: 0.6250 mg | Freq: Once | INTRAMUSCULAR | Status: DC | PRN

## 2023-09-16 MED ORDER — DEXAMETHASONE SODIUM PHOSPHATE 10 MG/ML IJ SOLN
INTRAMUSCULAR | Status: AC
  Filled 2023-09-16: qty 1

## 2023-09-16 MED ORDER — ROCURONIUM BROMIDE 10 MG/ML (PF) SYRINGE
PREFILLED_SYRINGE | INTRAVENOUS | Status: AC
  Filled 2023-09-16: qty 10

## 2023-09-16 MED ORDER — SODIUM CHLORIDE (PF) 0.9 % IJ SOLN
INTRAMUSCULAR | Status: DC | PRN
  Administered 2023-09-16: 40 mL

## 2023-09-16 MED ORDER — ACETAMINOPHEN 500 MG PO TABS
1000.0000 mg | ORAL_TABLET | ORAL | Status: AC
  Administered 2023-09-16: 1000 mg via ORAL
  Filled 2023-09-16: qty 2

## 2023-09-16 MED ORDER — ONDANSETRON HCL 4 MG/2ML IJ SOLN
INTRAMUSCULAR | Status: DC | PRN
  Administered 2023-09-16: 4 mg via INTRAVENOUS

## 2023-09-16 MED ORDER — OXYCODONE HCL 5 MG PO TABS
5.0000 mg | ORAL_TABLET | Freq: Once | ORAL | Status: AC
  Administered 2023-09-16: 5 mg via ORAL

## 2023-09-16 MED ORDER — CHLORHEXIDINE GLUCONATE CLOTH 2 % EX PADS: 6.0000 | MEDICATED_PAD | Freq: Once | CUTANEOUS | Status: DC

## 2023-09-16 MED ORDER — CHLORHEXIDINE GLUCONATE 0.12 % MT SOLN
15.0000 mL | Freq: Once | OROMUCOSAL | Status: AC
  Administered 2023-09-16: 15 mL via OROMUCOSAL
  Filled 2023-09-16: qty 15

## 2023-09-16 MED ORDER — OXYCODONE HCL 5 MG PO TABS
ORAL_TABLET | ORAL | Status: AC
Start: 2023-09-16 — End: ?
  Filled 2023-09-16: qty 1

## 2023-09-16 MED ORDER — 0.9 % SODIUM CHLORIDE (POUR BTL) OPTIME
TOPICAL | Status: DC | PRN
  Administered 2023-09-16: 1000 mL

## 2023-09-16 MED ORDER — ONDANSETRON HCL 4 MG/2ML IJ SOLN
INTRAMUSCULAR | Status: AC
  Filled 2023-09-16: qty 2

## 2023-09-16 MED ORDER — LIDOCAINE 2% (20 MG/ML) 5 ML SYRINGE
INTRAMUSCULAR | Status: DC | PRN
  Administered 2023-09-16: 60 mg via INTRAVENOUS

## 2023-09-16 MED ORDER — BUPIVACAINE HCL (PF) 0.25 % IJ SOLN
INTRAMUSCULAR | Status: AC
  Filled 2023-09-16: qty 30

## 2023-09-16 MED ORDER — OXYCODONE-ACETAMINOPHEN 10-325 MG PO TABS: 1.0000 | ORAL_TABLET | Freq: Four times a day (QID) | ORAL | 0 refills | Status: AC | PRN

## 2023-09-16 MED ORDER — ORAL CARE MOUTH RINSE: 15.0000 mL | Freq: Once | OROMUCOSAL | Status: AC

## 2023-09-16 MED ORDER — LIDOCAINE 2% (20 MG/ML) 5 ML SYRINGE
INTRAMUSCULAR | Status: AC
  Filled 2023-09-16: qty 5

## 2023-09-16 MED ORDER — ROCURONIUM BROMIDE 10 MG/ML (PF) SYRINGE
PREFILLED_SYRINGE | INTRAVENOUS | Status: DC | PRN
  Administered 2023-09-16: 50 mg via INTRAVENOUS
  Administered 2023-09-16: 20 mg via INTRAVENOUS

## 2023-09-16 MED ORDER — CEFAZOLIN SODIUM-DEXTROSE 2-4 GM/100ML-% IV SOLN
2.0000 g | INTRAVENOUS | Status: AC
  Administered 2023-09-16: 2 g via INTRAVENOUS
  Filled 2023-09-16: qty 100

## 2023-09-16 MED ORDER — PROPOFOL 10 MG/ML IV BOLUS
INTRAVENOUS | Status: DC | PRN
  Administered 2023-09-16: 180 mg via INTRAVENOUS

## 2023-09-16 MED ORDER — BUPIVACAINE LIPOSOME 1.3 % IJ SUSP
INTRAMUSCULAR | Status: AC
  Filled 2023-09-16: qty 20

## 2023-09-16 MED ORDER — DEXAMETHASONE SODIUM PHOSPHATE 10 MG/ML IJ SOLN
INTRAMUSCULAR | Status: DC | PRN
  Administered 2023-09-16: 10 mg via INTRAVENOUS

## 2023-09-16 MED ORDER — FENTANYL CITRATE (PF) 250 MCG/5ML IJ SOLN
INTRAMUSCULAR | Status: AC
  Filled 2023-09-16: qty 5

## 2023-09-16 MED ORDER — LACTATED RINGERS IV SOLN: INTRAVENOUS | Status: DC

## 2023-09-16 MED ORDER — MIDAZOLAM HCL 2 MG/2ML IJ SOLN
INTRAMUSCULAR | Status: AC
  Filled 2023-09-16: qty 2

## 2023-09-16 MED ORDER — FENTANYL CITRATE (PF) 250 MCG/5ML IJ SOLN
INTRAMUSCULAR | Status: DC | PRN
  Administered 2023-09-16 (×5): 50 ug via INTRAVENOUS

## 2023-09-16 MED ORDER — FENTANYL CITRATE (PF) 100 MCG/2ML IJ SOLN
INTRAMUSCULAR | Status: AC
  Filled 2023-09-16: qty 2

## 2023-09-16 MED ORDER — SUGAMMADEX SODIUM 200 MG/2ML IV SOLN
INTRAVENOUS | Status: DC | PRN
  Administered 2023-09-16: 300 mg via INTRAVENOUS

## 2023-09-16 MED ORDER — FENTANYL CITRATE (PF) 100 MCG/2ML IJ SOLN
25.0000 ug | INTRAMUSCULAR | Status: DC | PRN
  Administered 2023-09-16: 50 ug via INTRAVENOUS

## 2023-09-16 SURGICAL SUPPLY — 42 items
BAG COUNTER SPONGE SURGICOUNT (BAG) ×1 IMPLANT
BINDER ABDOMINAL 12 ML 46-62 (SOFTGOODS) IMPLANT
CANISTER SUCTION 3000ML PPV (SUCTIONS) IMPLANT
CHLORAPREP W/TINT 26 (MISCELLANEOUS) ×1 IMPLANT
CLIP APPLIE 5 13 M/L LIGAMAX5 (MISCELLANEOUS) IMPLANT
COVER SURGICAL LIGHT HANDLE (MISCELLANEOUS) ×1 IMPLANT
DERMABOND ADVANCED .7 DNX12 (GAUZE/BANDAGES/DRESSINGS) ×1 IMPLANT
DEVICE SECURE STRAP 25 ABSORB (INSTRUMENTS) ×1 IMPLANT
DEVICE TROCAR PUNCTURE CLOSURE (ENDOMECHANICALS) ×1 IMPLANT
ELECTRODE REM PT RTRN 9FT ADLT (ELECTROSURGICAL) ×1 IMPLANT
ENDOLOOP SUT PDS II 0 18 (SUTURE) IMPLANT
GLOVE BIO SURGEON STRL SZ7.5 (GLOVE) ×2 IMPLANT
GOWN STRL REUS W/ TWL LRG LVL3 (GOWN DISPOSABLE) ×2 IMPLANT
GOWN STRL REUS W/ TWL XL LVL3 (GOWN DISPOSABLE) ×1 IMPLANT
IRRIGATION SUCT STRKRFLW 2 WTP (MISCELLANEOUS) IMPLANT
KIT BASIN OR (CUSTOM PROCEDURE TRAY) ×1 IMPLANT
KIT TURNOVER KIT B (KITS) ×1 IMPLANT
MESH VENTRALIGHT ST 4X6IN (Mesh General) IMPLANT
MESH VENTRALIGHT ST 6IN CRC (Mesh General) IMPLANT
NDL INSUFFLATION 14GA 120MM (NEEDLE) ×1 IMPLANT
NEEDLE INSUFFLATION 14GA 120MM (NEEDLE) ×1 IMPLANT
NS IRRIG 1000ML POUR BTL (IV SOLUTION) ×1 IMPLANT
PAD ARMBOARD POSITIONER FOAM (MISCELLANEOUS) ×2 IMPLANT
POUCH LAPAROSCOPIC INSTRUMENT (MISCELLANEOUS) ×1 IMPLANT
SCISSORS LAP 5X35 DISP (ENDOMECHANICALS) ×1 IMPLANT
SET TUBE SMOKE EVAC HIGH FLOW (TUBING) ×1 IMPLANT
SHEARS HARMONIC ACE PLUS 36CM (ENDOMECHANICALS) IMPLANT
SLEEVE Z-THREAD 5X100MM (TROCAR) ×1 IMPLANT
SUT CHROMIC 2 0 SH (SUTURE) ×1 IMPLANT
SUT CHROMIC 3 0 SH 27 (SUTURE) IMPLANT
SUT ETHIBOND 0 MO6 C/R (SUTURE) IMPLANT
SUT MNCRL AB 4-0 PS2 18 (SUTURE) ×1 IMPLANT
SUT NOVA 1 T20/GS 25DT (SUTURE) IMPLANT
SUT PROLENE 2 0 KS (SUTURE) IMPLANT
TOWEL GREEN STERILE (TOWEL DISPOSABLE) ×1 IMPLANT
TOWEL GREEN STERILE FF (TOWEL DISPOSABLE) ×1 IMPLANT
TRAY FOLEY W/BAG SLVR 16FR ST (SET/KITS/TRAYS/PACK) IMPLANT
TRAY LAPAROSCOPIC MC (CUSTOM PROCEDURE TRAY) ×1 IMPLANT
TROCAR 11X100 Z THREAD (TROCAR) IMPLANT
TROCAR Z-THREAD OPTICAL 5X100M (TROCAR) ×1 IMPLANT
WARMER LAPAROSCOPE (MISCELLANEOUS) ×1 IMPLANT
WATER STERILE IRR 1000ML POUR (IV SOLUTION) ×1 IMPLANT

## 2023-09-16 NOTE — Transfer of Care (Signed)
 Immediate Anesthesia Transfer of Care Note  Patient: Charles Myers  Procedure(s) Performed: REPAIR, HERNIA, VENTRAL, LAPAROSCOPIC (Abdomen)  Patient Location: PACU  Anesthesia Type:General  Level of Consciousness: awake, alert , and oriented  Airway & Oxygen Therapy: Patient Spontanous Breathing  Post-op Assessment: Report given to RN and Post -op Vital signs reviewed and stable  Post vital signs: Reviewed and stable  Last Vitals:  Vitals Value Taken Time  BP 141/88 09/16/23 1408  Temp    Pulse 88 09/16/23 1410  Resp 14 09/16/23 1410  SpO2 92% 09/16/23 1410  Vitals shown include unfiled device data.  Last Pain:  Vitals:   09/16/23 1111  TempSrc:   PainSc: 0-No pain         Complications: There were no known notable events for this encounter.

## 2023-09-16 NOTE — Progress Notes (Signed)
 50mcg Fentanyl  wasted in stericycle with [facility] McClamroch RN  Judy Null, RN

## 2023-09-16 NOTE — Discharge Instructions (Signed)

## 2023-09-16 NOTE — Anesthesia Postprocedure Evaluation (Signed)
 Anesthesia Post Note  Patient: Charles Myers  Procedure(s) Performed: REPAIR, HERNIA, VENTRAL, LAPAROSCOPIC (Abdomen)     Patient location during evaluation: PACU Anesthesia Type: General Level of consciousness: awake and alert Pain management: pain level controlled Vital Signs Assessment: post-procedure vital signs reviewed and stable Respiratory status: spontaneous breathing, nonlabored ventilation, respiratory function stable and patient connected to nasal cannula oxygen Cardiovascular status: blood pressure returned to baseline and stable Postop Assessment: no apparent nausea or vomiting Anesthetic complications: no   There were no known notable events for this encounter.  Last Vitals:  Vitals:   09/16/23 1445 09/16/23 1500  BP: 128/70 130/68  Pulse: 82 81  Resp: 20 16  Temp:  36.4 C  SpO2: 99% 93%    Last Pain:  Vitals:   09/16/23 1430  TempSrc:   PainSc: 4                  Aadon Gorelik P Charlei Ramsaran

## 2023-09-16 NOTE — Interval H&P Note (Signed)
 History and Physical Interval Note:  09/16/2023 12:25 PM  Charles Myers  has presented today for surgery, with the diagnosis of RECURRENT VENTRAL HERNIA.  The various methods of treatment have been discussed with the patient and family. After consideration of risks, benefits and other options for treatment, the patient has consented to  Procedure(s): REPAIR, HERNIA, VENTRAL, LAPAROSCOPIC (N/A) as a surgical intervention.  The patient's history has been reviewed, patient examined, no change in status, stable for surgery.  I have reviewed the patient's chart and labs.  Questions were answered to the patient's satisfaction.     Charles Myers

## 2023-09-16 NOTE — Op Note (Signed)
 09/16/2023  1:52 PM  PATIENT:  Charles Myers  68 y.o. male  PRE-OPERATIVE DIAGNOSIS:  RECURRENT VENTRAL HERNIA  POST-OPERATIVE DIAGNOSIS:  RECURRENT VENTRAL HERNIA x2, 4cm superiorly and 3cm inferiorly, these were within 10 cm apart.  PROCEDURE:  Procedure(s): REPAIR, HERNIA, VENTRAL, LAPAROSCOPIC (N/A) with mesh x2  SURGEON:  Surgeons and Role:    Shela Derby, MD - Primary  ASSISTANTS: Hortense Lyons, RNFA   ANESTHESIA:   local and general  EBL:  25 mL   BLOOD ADMINISTERED:none  DRAINS: none   LOCAL MEDICATIONS USED:  BUPIVICAINE  and OTHER exparel   SPECIMEN:  No Specimen  DISPOSITION OF SPECIMEN:  N/A  COUNTS:  YES  TOURNIQUET:  * No tourniquets in log *  DICTATION: .Dragon Dictation Findings: Patient with 2 hernias.  Patient with a 4 cm hernia at the superior apex of the previous hernia repair and one 3 cm hernia at the inferior apex of the previous repair.  A piece of 15 x 10 cm mesh was placed inferiorly.  This was secured to the abdominal wall using a secure strap.  There was no tacks placed near the bladder area.  This flap was left and secured with a straight attachment of secure straps across the abdominal wall.  A piece of 15 cm circular mesh was placed superiorly.  The fascia was closed both hernia sites using 0 Ethibonds in interrupted fashion.  Details of the procedure:  After the patient was consented patient was taken back to the operating room patient was then placed in supine position bilateral SCDs in place.  The patient was prepped and draped in the usual sterile fashion. After antibiotics were confirmed a timeout was called and all facts were verified. The Veress needle technique was used to insuflate the abdomen at Palmer's point. The abdomen was insufflated to 14 mm mercury. Subsequently a 5 mm trocar was placed a camera inserted there was no injury to any intra-abdominal organs.  At this time there was some thin bowel and omental adhesions.   These were taken down sharply and bluntly from the anterior abdominal wall.  There was seen to be an nonincarcerated incarcerated 4 cm superior and inferior 3 cm recurrent hernia.  A second camera port was in placed into the left lower quadrant.   At this the Falicform ligament was taken down with Bovie cautery maintaining hemostasis.   I proceeded to reduce the hernia contents.   The fascia at the hernia was reapproximated using a #0 Ethibonds x 5 superiorly.  The inferior hernia was reapproximate using 3 #0 Ethibonds.  Once the hernia was cleared away, a Bard Ventralight 15 x 10 cm  mesh was inserted into the abdomen.  The mesh was secured circumferentially with am Securestrap tacker in a double crown fashion.  There was no tacks placed near the bladder.  A straight line of tacks was placed across the abdominal wall inferiorly.    At this time a 15 cm piece of mesh was placed into the abdominal cavity.  This was brought up to cover the superior hernia.  Secure straps were placed in a double crown fashion to approximate the mesh to the anterior abdominal wall.  The omentum was brought over the area of the mesh. The fascia at the left lower quadrant port site was reapproximated with a 0 vicryl and an endoclose device.  Exparel  was used to infiltrate the hernia repair sites as well as the left lower quadrant trocar site.  The pneumoperitoneum  was evacuated & all trocars  were removed. The skin was reapproximated with 4-0  Monocryl sutures in a subcuticular fashion. The skin was dressed with Dermabond.  The patient was taken to the recovery room in stable condition.  Type of repair -primary suture & mesh  Mesh overlap -4 cm for each hernia   Placement of mesh -  beneath fascia and into peritoneal cavity  Size: >10cm, Recurrent Hernia, and Reducible Hernia    PLAN OF CARE: Discharge to home after PACU  PATIENT DISPOSITION:  PACU - hemodynamically stable.   Delay start of Pharmacological VTE  agent (>24hrs) due to surgical blood loss or risk of bleeding: not applicable

## 2023-09-16 NOTE — Anesthesia Procedure Notes (Addendum)
 Procedure Name: Intubation Date/Time: 09/16/2023 12:57 PM  Performed by: Jamas Maywood, CRNAPre-anesthesia Checklist: Patient identified, Emergency Drugs available, Suction available and Patient being monitored Patient Re-evaluated:Patient Re-evaluated prior to induction Oxygen Delivery Method: Circle system utilized Preoxygenation: Pre-oxygenation with 100% oxygen Induction Type: IV induction Ventilation: Mask ventilation without difficulty Laryngoscope Size: Mac and 4 Grade View: Grade II Tube type: Oral Tube size: 7.5 mm Number of attempts: 1 Airway Equipment and Method: Stylet and Oral airway Placement Confirmation: ETT inserted through vocal cords under direct vision, positive ETCO2 and breath sounds checked- equal and bilateral Secured at: 22 cm Tube secured with: Tape Dental Injury: Teeth and Oropharynx as per pre-operative assessment

## 2023-09-16 NOTE — Anesthesia Preprocedure Evaluation (Signed)
 Anesthesia Evaluation  Patient identified by MRN, date of birth, ID band Patient awake    Reviewed: Allergy & Precautions, NPO status , Patient's Chart, lab work & pertinent test results  Airway Mallampati: II  TM Distance: >3 FB Neck ROM: Full    Dental no notable dental hx.    Pulmonary COPD, former smoker   Pulmonary exam normal        Cardiovascular + CAD   Rhythm:Regular Rate:Normal     Neuro/Psych   Anxiety        GI/Hepatic Neg liver ROS,GERD  Medicated,,Recurrent ventral hernia    Endo/Other  negative endocrine ROS    Renal/GU negative Renal ROS  negative genitourinary   Musculoskeletal negative musculoskeletal ROS (+)    Abdominal Normal abdominal exam  (+)   Peds  Hematology  (+) Blood dyscrasia, anemia   Anesthesia Other Findings   Reproductive/Obstetrics                             Anesthesia Physical Anesthesia Plan  ASA: 3  Anesthesia Plan: General   Post-op Pain Management:    Induction: Intravenous  PONV Risk Score and Plan: 2 and Ondansetron , Dexamethasone  and Treatment may vary due to age or medical condition  Airway Management Planned: Mask and Oral ETT  Additional Equipment: None  Intra-op Plan:   Post-operative Plan: Extubation in OR  Informed Consent: I have reviewed the patients History and Physical, chart, labs and discussed the procedure including the risks, benefits and alternatives for the proposed anesthesia with the patient or authorized representative who has indicated his/her understanding and acceptance.     Dental advisory given  Plan Discussed with: CRNA  Anesthesia Plan Comments:        Anesthesia Quick Evaluation

## 2023-09-17 ENCOUNTER — Encounter (HOSPITAL_COMMUNITY): Payer: Self-pay | Admitting: General Surgery

## 2024-05-15 NOTE — Progress Notes (Signed)
 "  New Patient Pulmonology Office Visit   Subjective:  Patient ID: Charles Myers, male    DOB: 18-May-1955  MRN: [id number]  Referred by: Dyane Anthony RAMAN, FNP  CC: No chief complaint on file.   HPI Charles Myers is a 69 y.o. male with hx HLD  Smoker Cough   {PULM QUESTIONNAIRES (Optional):33196}  ROS  Allergies: Patient has no known allergies. Current Medications[1] Past Medical History:  Diagnosis Date   Anemia    Anxiety    COPD (chronic obstructive pulmonary disease) (HCC)    Diverticulitis of large intestine with perforation and abscess 06/08/2022   Epididymitis    GERD (gastroesophageal reflux disease)    HLD (hyperlipidemia)    Lipoma of arm    Mild hearing loss    Past Surgical History:  Procedure Laterality Date   COLECTOMY WITH COLOSTOMY CREATION/HARTMANN PROCEDURE  06/21/2022   Perforated sigmoid diverticulitis with abscess = Hartmann resection [facility] in [place]    COLONOSCOPY     COLOSTOMY REVERSAL N/A 10/08/2022   Procedure: OPEN COLOSTOMY REVERSAL, CLOSURE OF COLOSTOMY;  Surgeon: Debby Hila, MD;  Location: [facility];  Service: General;  Laterality: N/A;   INCISIONAL HERNIA REPAIR N/A 05/13/2023   Procedure: OPEN HERNIA REPAIR INCISIONAL WITH MESH AND LOA;  Surgeon: Rubin Calamity, MD;  Location: [facility] OR;  Service: General;  Laterality: N/A;   LIPOMA EXCISION Left 07/27/2014   Procedure: EXCISION LIPOMA LEFT UPPER ARM;  Surgeon: Donnice Lima, MD;  Location: [facility] OR;  Service: General;  Laterality: Left;   LIPOMA EXCISION N/A 03/03/2017   Procedure: EXCISION OF LIPOMAS OF LEFT SHOULDER AND BACK OF NECK;  Surgeon: Lima Donnice, MD;  Location: [facility];  Service: General;  Laterality: N/A;   TONSILLECTOMY     VASECTOMY     VENTRAL HERNIA REPAIR N/A 09/16/2023   Procedure: REPAIR, HERNIA, VENTRAL, LAPAROSCOPIC;  Surgeon: Rubin Calamity, MD;  Location: [facility] OR;  Service: General;  Laterality: N/A;   Family History  Problem  Relation Age of Onset   Colon cancer Father    Lung cancer Brother    Breast cancer Sister    Social History   Socioeconomic History   Marital status: Married    Spouse name: Not on file   Number of children: Not on file   Years of education: Not on file   Highest education level: Not on file  Occupational History   Not on file  Tobacco Use   Smoking status: Former    Current packs/day: 0.00    Average packs/day: 1 pack/day for 30.0 years (30.0 ttl pk-yrs)    Types: Cigarettes    Start date: 05/04/1978    Quit date: 05/04/2008    Years since quitting: 16.0   Smokeless tobacco: Never   Tobacco comments:    Reinforced importance of remaining smoke free.  Vaping Use   Vaping status: Never Used  Substance and Sexual Activity   Alcohol use: Yes    Alcohol/week: 6.0 standard drinks of alcohol    Types: 6 Cans of beer per week   Drug use: No   Sexual activity: Yes  Other Topics Concern   Not on file  Social History Narrative   Not on file   Social Drivers of Health   Tobacco Use: Medium Risk (10/12/2023)   Received from [facility]   Patient History    Smoking Tobacco Use: Former    Smokeless Tobacco Use: Never    Passive Exposure:  Not on file  Financial Resource Strain: Not on file  Food Insecurity: No Food Insecurity (05/13/2023)   Hunger Vital Sign    Worried About Running Out of Food in the Last Year: Never true    Ran Out of Food in the Last Year: Never true  Transportation Needs: No Transportation Needs (05/13/2023)   PRAPARE - Administrator, Civil Service (Medical): No    Lack of Transportation (Non-Medical): No  Physical Activity: Not on file  Stress: Not on file  Social Connections: Moderately Isolated (05/13/2023)   Social Connection and Isolation Panel    Frequency of Communication with Friends and Family: Three times a week    Frequency of Social Gatherings with Friends and Family: Twice a week    Attends Religious Services: Never     Database Administrator or Organizations: No    Attends Banker Meetings: Never    Marital Status: Married  Catering Manager Violence: Not At Risk (05/13/2023)   Humiliation, Afraid, Rape, and Kick questionnaire    Fear of Current or Ex-Partner: No    Emotionally Abused: No    Physically Abused: No    Sexually Abused: No  Depression (PHQ2-9): Not on file  Alcohol Screen: Not on file  Housing: Unknown (06/04/2023)   Received from [facility]   Epic    Unable to Pay for Housing in the Last Year: Not on file    Number of Times Moved in the Last Year: Not on file    At any time in the past 12 months, were you homeless or living in a shelter (including now)?: No  Utilities: Not At Risk (05/13/2023)   [facility] Utilities    Threatened with loss of utilities: No  Health Literacy: Not on file       Objective:  There were no vitals taken for this visit. {Pulm Vitals (Optional):32837}  Physical Exam  Diagnostic Review:  {Labs (Optional):32838}     Assessment & Plan:   Assessment & Plan    No follow-ups on file.    Marny Patch, MD Pulmonary and Critical Care Medicine [name]    [1]  Current Outpatient Medications:    atorvastatin  (LIPITOR) 20 MG tablet, Take 20 mg by mouth every evening., Disp: , Rfl:    ezetimibe  (ZETIA ) 10 MG tablet, Take 1 tablet (10 mg total) by mouth daily. (Patient taking differently: Take 10 mg by mouth every evening.), Disp: 15 tablet, Rfl: 0   Multiple Vitamins-Minerals (MULTIVITAMIN WITH MINERALS) tablet, Take 1 tablet by mouth in the morning., Disp: , Rfl:    omeprazole (PRILOSEC) 40 MG capsule, Take 40 mg by mouth daily before breakfast., Disp: , Rfl:    oxyCODONE -acetaminophen  (PERCOCET) 10-325 MG tablet, Take 1 tablet by mouth every 6 (six) hours as needed for pain., Disp: 20 tablet, Rfl: 0  "

## 2024-05-16 ENCOUNTER — Ambulatory Visit

## 2024-05-16 VITALS — BP 118/66 | HR 86 | Temp 97.7°F | Ht 73.0 in | Wt 210.4 lb

## 2024-05-16 DIAGNOSIS — R053 Chronic cough: Secondary | ICD-10-CM | POA: Diagnosis not present

## 2024-05-16 DIAGNOSIS — Z87891 Personal history of nicotine dependence: Secondary | ICD-10-CM | POA: Diagnosis not present

## 2024-05-16 DIAGNOSIS — J4520 Mild intermittent asthma, uncomplicated: Secondary | ICD-10-CM

## 2024-05-16 MED ORDER — AEROCHAMBER MV MISC: 0 refills | Status: AC

## 2024-05-16 MED ORDER — PREDNISONE 10 MG PO TABS: ORAL_TABLET | ORAL | 0 refills | Status: AC

## 2024-05-16 MED ORDER — BUDESONIDE-FORMOTEROL FUMARATE 80-4.5 MCG/ACT IN AERO: 2.0000 | INHALATION_SPRAY | Freq: Two times a day (BID) | RESPIRATORY_TRACT | 3 refills | Status: AC

## 2024-05-16 MED ORDER — BUDESONIDE-FORMOTEROL FUMARATE 80-4.5 MCG/ACT IN AERO: 2.0000 | INHALATION_SPRAY | Freq: Two times a day (BID) | RESPIRATORY_TRACT | 6 refills | Status: DC

## 2024-05-16 NOTE — Patient Instructions (Addendum)
 Dear Mr. Charles Myers;   Given your chronic cough, I would like to try some asthma therapies. -Symbicort  2 puffs twice a day for at least 6 weeks. If you don't see any effect, feel free to stop it.  -Prednisone  taper for 10 days, 40 mg for 3 days, 30 mg for 3 days, 20 mg for 2 days and 10 mg for 2 days. -I will do a pulmonary function in 3 months -I will see you in 3 months.   INHALER INSTRUCTIONS: To use the inhaler you follow these steps: Prime the inhaler according to instructions (which means waste between 1-4 doses before next use).  Follow package insert Shake the inhaler before each use Connect spacer Exhale completely by blowing all the air out of your lungs Seal your mouth around the spacer Press down on the canister then inhale SLOW and STEADY until your fill your lungs completely. Hold the breath for 6-10 seconds Gently exhale Wait 60 seconds then repeat steps 2-8. Rinse the mouth after each use to prevent thrush.   Your pharmacist can also review proper technique if you have any remaining questions. Let me know if the cost is too high, your insurance may be able to recommend a more affordable option for you.

## 2024-08-22 ENCOUNTER — Encounter

## 2024-08-22 ENCOUNTER — Ambulatory Visit
